# Patient Record
Sex: Female | Born: 1935 | Race: White | Hispanic: No | Marital: Married | Smoking: Never smoker
Health system: Southern US, Community
[De-identification: ages and names within clinical notes are randomized; demographics above are authoritative.]

## PROBLEM LIST (undated history)

## (undated) DIAGNOSIS — M1990 Unspecified osteoarthritis, unspecified site: Secondary | ICD-10-CM

## (undated) DIAGNOSIS — IMO0001 Reserved for inherently not codable concepts without codable children: Secondary | ICD-10-CM

## (undated) DIAGNOSIS — F0390 Unspecified dementia without behavioral disturbance: Secondary | ICD-10-CM

## (undated) DIAGNOSIS — G4730 Sleep apnea, unspecified: Secondary | ICD-10-CM

## (undated) DIAGNOSIS — F329 Major depressive disorder, single episode, unspecified: Secondary | ICD-10-CM

## (undated) DIAGNOSIS — F419 Anxiety disorder, unspecified: Secondary | ICD-10-CM

## (undated) DIAGNOSIS — C801 Malignant (primary) neoplasm, unspecified: Secondary | ICD-10-CM

## (undated) DIAGNOSIS — Z789 Other specified health status: Secondary | ICD-10-CM

## (undated) DIAGNOSIS — F32A Depression, unspecified: Secondary | ICD-10-CM

## (undated) HISTORY — PX: TONSILLECTOMY: SUR1361

## (undated) HISTORY — PX: CATARACT EXTRACTION: SUR2

---

## 2015-09-18 DIAGNOSIS — Z23 Encounter for immunization: Secondary | ICD-10-CM | POA: Diagnosis not present

## 2015-09-27 DIAGNOSIS — G4733 Obstructive sleep apnea (adult) (pediatric): Secondary | ICD-10-CM | POA: Diagnosis not present

## 2015-10-24 DIAGNOSIS — R69 Illness, unspecified: Secondary | ICD-10-CM | POA: Diagnosis not present

## 2015-10-29 DIAGNOSIS — R69 Illness, unspecified: Secondary | ICD-10-CM | POA: Diagnosis not present

## 2015-12-18 DIAGNOSIS — R262 Difficulty in walking, not elsewhere classified: Secondary | ICD-10-CM | POA: Diagnosis not present

## 2015-12-18 DIAGNOSIS — M170 Bilateral primary osteoarthritis of knee: Secondary | ICD-10-CM | POA: Diagnosis not present

## 2015-12-20 DIAGNOSIS — Z6833 Body mass index (BMI) 33.0-33.9, adult: Secondary | ICD-10-CM | POA: Diagnosis not present

## 2015-12-20 DIAGNOSIS — M1711 Unilateral primary osteoarthritis, right knee: Secondary | ICD-10-CM | POA: Diagnosis not present

## 2015-12-20 DIAGNOSIS — E669 Obesity, unspecified: Secondary | ICD-10-CM | POA: Diagnosis not present

## 2015-12-27 DIAGNOSIS — M25461 Effusion, right knee: Secondary | ICD-10-CM | POA: Diagnosis not present

## 2015-12-27 DIAGNOSIS — G8929 Other chronic pain: Secondary | ICD-10-CM | POA: Diagnosis not present

## 2015-12-27 DIAGNOSIS — M1711 Unilateral primary osteoarthritis, right knee: Secondary | ICD-10-CM | POA: Diagnosis not present

## 2015-12-27 DIAGNOSIS — M25561 Pain in right knee: Secondary | ICD-10-CM | POA: Diagnosis not present

## 2016-01-01 DIAGNOSIS — G4733 Obstructive sleep apnea (adult) (pediatric): Secondary | ICD-10-CM | POA: Diagnosis not present

## 2016-01-02 ENCOUNTER — Other Ambulatory Visit: Payer: Self-pay | Admitting: Orthopedic Surgery

## 2016-01-10 ENCOUNTER — Encounter (HOSPITAL_COMMUNITY): Payer: Self-pay

## 2016-01-10 ENCOUNTER — Encounter (HOSPITAL_COMMUNITY)
Admission: RE | Admit: 2016-01-10 | Discharge: 2016-01-10 | Disposition: A | Discharge status: Home / Self Care | Payer: Medicare HMO | Source: Ambulatory Visit | Attending: Orthopedic Surgery | Admitting: Orthopedic Surgery

## 2016-01-10 DIAGNOSIS — Z79899 Other long term (current) drug therapy: Secondary | ICD-10-CM | POA: Diagnosis not present

## 2016-01-10 DIAGNOSIS — R918 Other nonspecific abnormal finding of lung field: Secondary | ICD-10-CM | POA: Insufficient documentation

## 2016-01-10 DIAGNOSIS — Z01812 Encounter for preprocedural laboratory examination: Secondary | ICD-10-CM | POA: Diagnosis not present

## 2016-01-10 DIAGNOSIS — R69 Illness, unspecified: Secondary | ICD-10-CM | POA: Diagnosis not present

## 2016-01-10 DIAGNOSIS — M1711 Unilateral primary osteoarthritis, right knee: Secondary | ICD-10-CM | POA: Insufficient documentation

## 2016-01-10 DIAGNOSIS — Z01818 Encounter for other preprocedural examination: Secondary | ICD-10-CM | POA: Diagnosis not present

## 2016-01-10 DIAGNOSIS — G4733 Obstructive sleep apnea (adult) (pediatric): Secondary | ICD-10-CM | POA: Diagnosis not present

## 2016-01-10 DIAGNOSIS — F329 Major depressive disorder, single episode, unspecified: Secondary | ICD-10-CM | POA: Diagnosis not present

## 2016-01-10 DIAGNOSIS — R9431 Abnormal electrocardiogram [ECG] [EKG]: Secondary | ICD-10-CM | POA: Diagnosis not present

## 2016-01-10 DIAGNOSIS — F0390 Unspecified dementia without behavioral disturbance: Secondary | ICD-10-CM | POA: Insufficient documentation

## 2016-01-10 HISTORY — DX: Anxiety disorder, unspecified: F41.9

## 2016-01-10 HISTORY — DX: Sleep apnea, unspecified: G47.30

## 2016-01-10 HISTORY — DX: Unspecified dementia, unspecified severity, without behavioral disturbance, psychotic disturbance, mood disturbance, and anxiety: F03.90

## 2016-01-10 HISTORY — DX: Reserved for inherently not codable concepts without codable children: IMO0001

## 2016-01-10 HISTORY — DX: Unspecified osteoarthritis, unspecified site: M19.90

## 2016-01-10 HISTORY — DX: Depression, unspecified: F32.A

## 2016-01-10 HISTORY — DX: Malignant (primary) neoplasm, unspecified: C80.1

## 2016-01-10 HISTORY — DX: Major depressive disorder, single episode, unspecified: F32.9

## 2016-01-10 LAB — CBC WITH DIFFERENTIAL/PLATELET
BASOS ABS: 0 10*3/uL (ref 0.0–0.1)
BASOS PCT: 0 %
Eosinophils Absolute: 0.1 10*3/uL (ref 0.0–0.7)
Eosinophils Relative: 1 %
HEMATOCRIT: 44.8 % (ref 36.0–46.0)
HEMOGLOBIN: 14.8 g/dL (ref 12.0–15.0)
Lymphocytes Relative: 25 %
Lymphs Abs: 1.7 10*3/uL (ref 0.7–4.0)
MCH: 29.8 pg (ref 26.0–34.0)
MCHC: 33 g/dL (ref 30.0–36.0)
MCV: 90.1 fL (ref 78.0–100.0)
Monocytes Absolute: 0.7 10*3/uL (ref 0.1–1.0)
Monocytes Relative: 10 %
NEUTROS ABS: 4.2 10*3/uL (ref 1.7–7.7)
NEUTROS PCT: 64 %
Platelets: 203 10*3/uL (ref 150–400)
RBC: 4.97 MIL/uL (ref 3.87–5.11)
RDW: 13 % (ref 11.5–15.5)
WBC: 6.7 10*3/uL (ref 4.0–10.5)

## 2016-01-10 LAB — COMPREHENSIVE METABOLIC PANEL
ALBUMIN: 4 g/dL (ref 3.5–5.0)
ALK PHOS: 50 U/L (ref 38–126)
ALT: 16 U/L (ref 14–54)
AST: 20 U/L (ref 15–41)
Anion gap: 8 (ref 5–15)
BILIRUBIN TOTAL: 0.8 mg/dL (ref 0.3–1.2)
BUN: 15 mg/dL (ref 6–20)
CALCIUM: 9.5 mg/dL (ref 8.9–10.3)
CO2: 26 mmol/L (ref 22–32)
Chloride: 106 mmol/L (ref 101–111)
Creatinine, Ser: 0.83 mg/dL (ref 0.44–1.00)
GFR calc Af Amer: >60 mL/min (ref >60)
GFR calc non Af Amer: >60 mL/min (ref >60)
GLUCOSE: 99 mg/dL (ref 65–99)
POTASSIUM: 4.2 mmol/L (ref 3.5–5.1)
SODIUM: 140 mmol/L (ref 135–145)
TOTAL PROTEIN: 7.1 g/dL (ref 6.5–8.1)

## 2016-01-10 LAB — URINALYSIS, ROUTINE W REFLEX MICROSCOPIC
Glucose, UA: NEGATIVE mg/dL
Hgb urine dipstick: NEGATIVE
Ketones, ur: 15 mg/dL — AB
NITRITE: NEGATIVE
PH: 5 (ref 5.0–8.0)
Protein, ur: NEGATIVE mg/dL
SPECIFIC GRAVITY, URINE: 1.026 (ref 1.005–1.030)

## 2016-01-10 LAB — SURGICAL PCR SCREEN
MRSA, PCR: NEGATIVE
Staphylococcus aureus: NEGATIVE

## 2016-01-10 LAB — PROTIME-INR
INR: 1.05 (ref 0.00–1.49)
Prothrombin Time: 13.9 seconds (ref 11.6–15.2)

## 2016-01-10 LAB — URINE MICROSCOPIC-ADD ON

## 2016-01-10 LAB — APTT: APTT: 28 s (ref 24–37)

## 2016-01-10 NOTE — Pre-Procedure Instructions (Signed)
    Brianna Pineda  01/10/2016      [facility] - [place], [place] - [place]  [place] [address] Phone: [phone] Fax: [phone]    Your procedure is scheduled on 01/21/16.  Report to [facility] Admitting at 530 A.M.  Call this number if you have problems the morning of surgery:  [phone]   Remember:  Do not eat food or drink liquids after midnight.  Take these medicines the morning of surgery with A SIP OF WATER --namenda,effexor   Do not wear jewelry, make-up or nail polish.  Do not wear lotions, powders, or perfumes.  You may wear deodorant.  Do not shave 48 hours prior to surgery.  Men may shave face and neck.  Do not bring valuables to the hospital.  [facility] is not responsible for any belongings or valuables.  Contacts, dentures or bridgework may not be worn into surgery.  Leave your suitcase in the car.  After surgery it may be brought to your room.  For patients admitted to the hospital, discharge time will be determined by your treatment team.  Patients discharged the day of surgery will not be allowed to drive home.   Name and phone number of your driver:    Special instructions:    Please read over the following fact sheets that you were given. Pain Booklet, Coughing and Deep Breathing, MRSA Information and Surgical Site Infection Prevention

## 2016-01-11 ENCOUNTER — Ambulatory Visit
Admission: RE | Admit: 2016-01-11 | Discharge: 2016-01-11 | Disposition: A | Discharge status: Home / Self Care | Payer: Medicare HMO | Source: Ambulatory Visit | Attending: Orthopedic Surgery | Admitting: Orthopedic Surgery

## 2016-01-11 ENCOUNTER — Other Ambulatory Visit: Payer: Self-pay | Admitting: Orthopedic Surgery

## 2016-01-11 DIAGNOSIS — M1711 Unilateral primary osteoarthritis, right knee: Secondary | ICD-10-CM | POA: Diagnosis not present

## 2016-01-11 DIAGNOSIS — R918 Other nonspecific abnormal finding of lung field: Secondary | ICD-10-CM | POA: Diagnosis not present

## 2016-01-11 DIAGNOSIS — R9389 Abnormal findings on diagnostic imaging of other specified body structures: Secondary | ICD-10-CM

## 2016-01-11 LAB — URINE CULTURE

## 2016-01-11 MED ORDER — IOPAMIDOL (ISOVUE-300) INJECTION 61%
75.0000 mL | Freq: Once | INTRAVENOUS | Status: AC | PRN
  Administered 2016-01-11: 75 mL via INTRAVENOUS

## 2016-01-15 NOTE — Progress Notes (Signed)
Anesthesia Chart Review: Patient is a 82 year old female scheduled for right TKA on 01/21/16 by Dr. Ronnie Derby.   History includes non-smoker, dementia, depression, skin cancer excision (nose), arthritis, tonsillectomy, OSA with CPAP, SOB. BMI is consistent with obesity. Jehovah's Witness. PCP is Dr. Nelda Bucks.  Meds include Namenda XR, rivastigmine, Effexor XR. She is on Namenda and rivastigmine for her memory. Discussed holding these after her Sunday 01/20/16 AM dose but can resume after her 01/21/16 surgery.    01/10/16 EKG: NSR, possible inferior infarct (age undetermined), possible anterior infarct (age undetermined). There is no comparison EKG on her PCP office or in Epic/Muse. She denied CP, SOB at rest, palpitations, syncope. Denied prior stress, echo, or cath. Over the past month, she has had had more knee pain and is not doing much activity. Prior to that she was going to the gym and walking on a treadmill for 10 minutes and doing reflexology without CV symptoms.   01/10/16 CXR: IMPRESSION: Parenchymal density in the left upper lobe and likely in the right middle lobe. The findings are nonspecific. Given the lack of any previous studies, chest CT scanning is recommended.  01/11/16 Chest CT w/ contrast: IMPRESSION: 14 x 8 mm anterior segment of left upper lobe subpleural ground-glass opacity without a solid component. Differential diagnosis includes posttraumatic change, bronchoalveolar carcinoma, or inflammatory change. Follow-up CT of the chest in 3 months is Recommended. No significant mediastinal or hilar lymphadenopathy.  Preoperative labs noted. Urine culture showed multiple species present, suggest recollection.  History and EKG reviewed with anesthesiologist Dr. Tobias Alexander. If patient remains asymptomatic from a CV standpoint then it is anticipated that she can proceed as planned. She did say that Dr. Ronnie Derby discussed GA versus spinal anesthesia. She is still considering her options.    George Hugh [facility]/Anesthesiology Phone [phone] 01/15/2016 3:47 PM

## 2016-01-20 MED ORDER — SODIUM CHLORIDE 0.9 % IV SOLN: INTRAVENOUS | Status: DC

## 2016-01-20 MED ORDER — BUPIVACAINE LIPOSOME 1.3 % IJ SUSP
20.0000 mL | INTRAMUSCULAR | Status: AC
  Administered 2016-01-21: 20 mL
  Filled 2016-01-20: qty 20

## 2016-01-20 MED ORDER — CEFAZOLIN SODIUM-DEXTROSE 2-3 GM-% IV SOLR
2.0000 g | INTRAVENOUS | Status: AC
  Administered 2016-01-21: 2 g via INTRAVENOUS
  Filled 2016-01-20: qty 50

## 2016-01-20 MED ORDER — CHLORHEXIDINE GLUCONATE 4 % EX LIQD: 60.0000 mL | Freq: Once | CUTANEOUS | Status: DC

## 2016-01-20 MED ORDER — TRANEXAMIC ACID 1000 MG/10ML IV SOLN
1000.0000 mg | INTRAVENOUS | Status: AC
  Administered 2016-01-21: 1000 mg via INTRAVENOUS
  Filled 2016-01-20: qty 10

## 2016-01-20 NOTE — Anesthesia Preprocedure Evaluation (Addendum)
Anesthesia Evaluation  Patient identified by MRN, date of birth, ID band Patient awake    Reviewed: Allergy & Precautions, NPO status , Patient's Chart, lab work & pertinent test results  History of Anesthesia Complications Negative for: history of anesthetic complications  Airway Mallampati: II  TM Distance: >3 FB Neck ROM: Full    Dental  (+) Poor Dentition, Dental Advisory Given   Pulmonary sleep apnea ,    Pulmonary exam normal        Cardiovascular negative cardio ROS Normal cardiovascular exam     Neuro/Psych PSYCHIATRIC DISORDERS Anxiety Depression dementia    GI/Hepatic negative GI ROS, Neg liver ROS,   Endo/Other  negative endocrine ROS  Renal/GU negative Renal ROS     Musculoskeletal   Abdominal   Peds  Hematology   Anesthesia Other Findings   Reproductive/Obstetrics                            Anesthesia Physical Anesthesia Plan  ASA: II  Anesthesia Plan: MAC   Post-op Pain Management:    Induction:   Airway Management Planned: Simple Face Mask  Additional Equipment:   Intra-op Plan:   Post-operative Plan:   Informed Consent: I have reviewed the patients History and Physical, chart, labs and discussed the procedure including the risks, benefits and alternatives for the proposed anesthesia with the patient or authorized representative who has indicated his/her understanding and acceptance.   Dental advisory given  Plan Discussed with: CRNA and Anesthesiologist  Anesthesia Plan Comments:        Anesthesia Quick Evaluation

## 2016-01-21 ENCOUNTER — Encounter (HOSPITAL_COMMUNITY): Payer: Self-pay | Admitting: Certified Registered Nurse Anesthetist

## 2016-01-21 ENCOUNTER — Inpatient Hospital Stay (HOSPITAL_COMMUNITY): Payer: Medicare HMO | Admitting: Certified Registered Nurse Anesthetist

## 2016-01-21 ENCOUNTER — Inpatient Hospital Stay (HOSPITAL_COMMUNITY)
Admission: RE | Admit: 2016-01-21 | Discharge: 2016-01-22 | DRG: 470 | Disposition: A | Discharge status: Home / Self Care | Payer: Medicare HMO | Source: Ambulatory Visit | Attending: Orthopedic Surgery | Admitting: Orthopedic Surgery

## 2016-01-21 ENCOUNTER — Inpatient Hospital Stay (HOSPITAL_COMMUNITY): Payer: Medicare HMO | Admitting: Vascular Surgery

## 2016-01-21 ENCOUNTER — Encounter (HOSPITAL_COMMUNITY)
Admission: RE | Disposition: A | Discharge status: Home / Self Care | Payer: Self-pay | Source: Ambulatory Visit | Attending: Orthopedic Surgery

## 2016-01-21 DIAGNOSIS — D62 Acute posthemorrhagic anemia: Secondary | ICD-10-CM | POA: Diagnosis not present

## 2016-01-21 DIAGNOSIS — Z85828 Personal history of other malignant neoplasm of skin: Secondary | ICD-10-CM

## 2016-01-21 DIAGNOSIS — M1711 Unilateral primary osteoarthritis, right knee: Secondary | ICD-10-CM | POA: Diagnosis not present

## 2016-01-21 DIAGNOSIS — Z79899 Other long term (current) drug therapy: Secondary | ICD-10-CM | POA: Diagnosis not present

## 2016-01-21 DIAGNOSIS — F329 Major depressive disorder, single episode, unspecified: Secondary | ICD-10-CM | POA: Diagnosis present

## 2016-01-21 DIAGNOSIS — Z96659 Presence of unspecified artificial knee joint: Secondary | ICD-10-CM

## 2016-01-21 DIAGNOSIS — F419 Anxiety disorder, unspecified: Secondary | ICD-10-CM | POA: Diagnosis present

## 2016-01-21 DIAGNOSIS — M25561 Pain in right knee: Secondary | ICD-10-CM | POA: Diagnosis present

## 2016-01-21 DIAGNOSIS — G4730 Sleep apnea, unspecified: Secondary | ICD-10-CM | POA: Diagnosis present

## 2016-01-21 DIAGNOSIS — F0390 Unspecified dementia without behavioral disturbance: Secondary | ICD-10-CM | POA: Diagnosis present

## 2016-01-21 DIAGNOSIS — R69 Illness, unspecified: Secondary | ICD-10-CM | POA: Diagnosis not present

## 2016-01-21 DIAGNOSIS — M179 Osteoarthritis of knee, unspecified: Secondary | ICD-10-CM | POA: Diagnosis not present

## 2016-01-21 HISTORY — PX: TOTAL KNEE ARTHROPLASTY: SHX125

## 2016-01-21 LAB — CBC
HEMATOCRIT: 41.2 % (ref 36.0–46.0)
Hemoglobin: 13.6 g/dL (ref 12.0–15.0)
MCH: 30.3 pg (ref 26.0–34.0)
MCHC: 33 g/dL (ref 30.0–36.0)
MCV: 91.8 fL (ref 78.0–100.0)
PLATELETS: 163 10*3/uL (ref 150–400)
RBC: 4.49 MIL/uL (ref 3.87–5.11)
RDW: 13.5 % (ref 11.5–15.5)
WBC: 5.3 10*3/uL (ref 4.0–10.5)

## 2016-01-21 LAB — CREATININE, SERUM
Creatinine, Ser: 0.91 mg/dL (ref 0.44–1.00)
GFR calc non Af Amer: 58 mL/min — ABNORMAL LOW (ref >60)

## 2016-01-21 SURGERY — ARTHROPLASTY, KNEE, TOTAL
Anesthesia: Monitor Anesthesia Care | Site: Knee | Laterality: Right

## 2016-01-21 MED ORDER — PROPOFOL 10 MG/ML IV BOLUS
INTRAVENOUS | Status: DC | PRN
  Administered 2016-01-21: 10 mg via INTRAVENOUS

## 2016-01-21 MED ORDER — ONDANSETRON HCL 4 MG/2ML IJ SOLN
INTRAMUSCULAR | Status: AC
  Filled 2016-01-21: qty 2

## 2016-01-21 MED ORDER — ZOLPIDEM TARTRATE 5 MG PO TABS: 5.0000 mg | ORAL_TABLET | Freq: Every evening | ORAL | Status: DC | PRN

## 2016-01-21 MED ORDER — HYDROMORPHONE HCL 1 MG/ML IJ SOLN
1.0000 mg | INTRAMUSCULAR | Status: DC | PRN
  Filled 2016-01-21: qty 1

## 2016-01-21 MED ORDER — SODIUM CHLORIDE 0.9 % IV SOLN
INTRAVENOUS | Status: DC
  Administered 2016-01-21: 13:00:00 via INTRAVENOUS

## 2016-01-21 MED ORDER — CEFAZOLIN SODIUM-DEXTROSE 2-3 GM-% IV SOLR
2.0000 g | Freq: Four times a day (QID) | INTRAVENOUS | Status: AC
  Administered 2016-01-21 (×2): 2 g via INTRAVENOUS
  Filled 2016-01-21 (×2): qty 50

## 2016-01-21 MED ORDER — FENTANYL CITRATE (PF) 100 MCG/2ML IJ SOLN
INTRAMUSCULAR | Status: DC | PRN
  Administered 2016-01-21 (×3): 25 ug via INTRAVENOUS

## 2016-01-21 MED ORDER — PROPOFOL 10 MG/ML IV BOLUS
INTRAVENOUS | Status: AC
  Filled 2016-01-21: qty 20

## 2016-01-21 MED ORDER — BUPIVACAINE IN DEXTROSE 0.75-8.25 % IT SOLN
INTRATHECAL | Status: DC | PRN
  Administered 2016-01-21: 15 mg via INTRATHECAL

## 2016-01-21 MED ORDER — ONDANSETRON HCL 4 MG/2ML IJ SOLN: 4.0000 mg | Freq: Four times a day (QID) | INTRAMUSCULAR | Status: DC | PRN

## 2016-01-21 MED ORDER — PHENOL 1.4 % MT LIQD: 1.0000 | OROMUCOSAL | Status: DC | PRN

## 2016-01-21 MED ORDER — SODIUM CHLORIDE 0.9 % IR SOLN
Status: DC | PRN
  Administered 2016-01-21: 3000 mL

## 2016-01-21 MED ORDER — DEXAMETHASONE SODIUM PHOSPHATE 4 MG/ML IJ SOLN
INTRAMUSCULAR | Status: DC | PRN
  Administered 2016-01-21: 8 mg via INTRAVENOUS

## 2016-01-21 MED ORDER — LACTATED RINGERS IV SOLN
INTRAVENOUS | Status: DC | PRN
  Administered 2016-01-21 (×2): via INTRAVENOUS

## 2016-01-21 MED ORDER — ACETAMINOPHEN 650 MG RE SUPP: 650.0000 mg | Freq: Four times a day (QID) | RECTAL | Status: DC | PRN

## 2016-01-21 MED ORDER — MEMANTINE HCL ER 28 MG PO CP24
28.0000 mg | ORAL_CAPSULE | Freq: Every day | ORAL | Status: DC
  Administered 2016-01-22: 28 mg via ORAL
  Filled 2016-01-21: qty 1

## 2016-01-21 MED ORDER — CELECOXIB 200 MG PO CAPS
200.0000 mg | ORAL_CAPSULE | Freq: Two times a day (BID) | ORAL | Status: DC
  Administered 2016-01-21 – 2016-01-22 (×3): 200 mg via ORAL
  Filled 2016-01-21 (×3): qty 1

## 2016-01-21 MED ORDER — FLEET ENEMA 7-19 GM/118ML RE ENEM
1.0000 | ENEMA | Freq: Once | RECTAL | Status: DC | PRN
Start: 2016-01-21 — End: 2016-01-22

## 2016-01-21 MED ORDER — TRANEXAMIC ACID 1000 MG/10ML IV SOLN
1000.0000 mg | Freq: Once | INTRAVENOUS | Status: AC
  Administered 2016-01-21: 1000 mg via INTRAVENOUS
  Filled 2016-01-21 (×3): qty 10

## 2016-01-21 MED ORDER — LIDOCAINE HCL (CARDIAC) 20 MG/ML IV SOLN
INTRAVENOUS | Status: AC
  Filled 2016-01-21: qty 5

## 2016-01-21 MED ORDER — BUPIVACAINE-EPINEPHRINE (PF) 0.25% -1:200000 IJ SOLN
INTRAMUSCULAR | Status: DC | PRN
  Administered 2016-01-21: 30 mL via PERINEURAL

## 2016-01-21 MED ORDER — 0.9 % SODIUM CHLORIDE (POUR BTL) OPTIME
TOPICAL | Status: DC | PRN
  Administered 2016-01-21: 1000 mL

## 2016-01-21 MED ORDER — DEXAMETHASONE SODIUM PHOSPHATE 4 MG/ML IJ SOLN
INTRAMUSCULAR | Status: AC
  Filled 2016-01-21: qty 2

## 2016-01-21 MED ORDER — RIVASTIGMINE TARTRATE 1.5 MG PO CAPS
4.5000 mg | ORAL_CAPSULE | Freq: Two times a day (BID) | ORAL | Status: DC
  Administered 2016-01-21 – 2016-01-22 (×2): 4.5 mg via ORAL
  Filled 2016-01-21 (×4): qty 1

## 2016-01-21 MED ORDER — DIPHENHYDRAMINE HCL 12.5 MG/5ML PO ELIX: 12.5000 mg | ORAL_SOLUTION | ORAL | Status: DC | PRN

## 2016-01-21 MED ORDER — OXYCODONE HCL 5 MG PO TABS
5.0000 mg | ORAL_TABLET | ORAL | Status: DC | PRN
  Administered 2016-01-21 – 2016-01-22 (×4): 5 mg via ORAL
  Administered 2016-01-22 (×2): 10 mg via ORAL
  Filled 2016-01-21: qty 2
  Filled 2016-01-21 (×4): qty 1
  Filled 2016-01-21: qty 2

## 2016-01-21 MED ORDER — PROMETHAZINE HCL 25 MG/ML IJ SOLN: 6.2500 mg | INTRAMUSCULAR | Status: DC | PRN

## 2016-01-21 MED ORDER — OXYCODONE HCL ER 10 MG PO T12A
10.0000 mg | EXTENDED_RELEASE_TABLET | Freq: Two times a day (BID) | ORAL | Status: DC
  Administered 2016-01-21 – 2016-01-22 (×3): 10 mg via ORAL
  Filled 2016-01-21 (×3): qty 1

## 2016-01-21 MED ORDER — METOCLOPRAMIDE HCL 5 MG/ML IJ SOLN: 5.0000 mg | Freq: Three times a day (TID) | INTRAMUSCULAR | Status: DC | PRN

## 2016-01-21 MED ORDER — SENNOSIDES-DOCUSATE SODIUM 8.6-50 MG PO TABS: 1.0000 | ORAL_TABLET | Freq: Every evening | ORAL | Status: DC | PRN

## 2016-01-21 MED ORDER — BUPIVACAINE-EPINEPHRINE (PF) 0.25% -1:200000 IJ SOLN
INTRAMUSCULAR | Status: AC
  Filled 2016-01-21: qty 30

## 2016-01-21 MED ORDER — ALUM & MAG HYDROXIDE-SIMETH 200-200-20 MG/5ML PO SUSP: 30.0000 mL | ORAL | Status: DC | PRN

## 2016-01-21 MED ORDER — METOCLOPRAMIDE HCL 5 MG PO TABS: 5.0000 mg | ORAL_TABLET | Freq: Three times a day (TID) | ORAL | Status: DC | PRN

## 2016-01-21 MED ORDER — DEXTROSE 5 % IV SOLN
500.0000 mg | Freq: Four times a day (QID) | INTRAVENOUS | Status: DC | PRN
  Filled 2016-01-21: qty 5

## 2016-01-21 MED ORDER — ONDANSETRON HCL 4 MG PO TABS: 4.0000 mg | ORAL_TABLET | Freq: Four times a day (QID) | ORAL | Status: DC | PRN

## 2016-01-21 MED ORDER — BISACODYL 5 MG PO TBEC: 5.0000 mg | DELAYED_RELEASE_TABLET | Freq: Every day | ORAL | Status: DC | PRN

## 2016-01-21 MED ORDER — MENTHOL 3 MG MT LOZG: 1.0000 | LOZENGE | OROMUCOSAL | Status: DC | PRN

## 2016-01-21 MED ORDER — HYDROMORPHONE HCL 1 MG/ML IJ SOLN: 0.2500 mg | INTRAMUSCULAR | Status: DC | PRN

## 2016-01-21 MED ORDER — PROPOFOL 500 MG/50ML IV EMUL
INTRAVENOUS | Status: DC | PRN
  Administered 2016-01-21: 40 ug/kg/min via INTRAVENOUS

## 2016-01-21 MED ORDER — FENTANYL CITRATE (PF) 250 MCG/5ML IJ SOLN
INTRAMUSCULAR | Status: AC
  Filled 2016-01-21: qty 5

## 2016-01-21 MED ORDER — ACETAMINOPHEN 325 MG PO TABS: 650.0000 mg | ORAL_TABLET | Freq: Four times a day (QID) | ORAL | Status: DC | PRN

## 2016-01-21 MED ORDER — ENOXAPARIN SODIUM 30 MG/0.3ML ~~LOC~~ SOLN
30.0000 mg | Freq: Two times a day (BID) | SUBCUTANEOUS | Status: DC
  Administered 2016-01-22: 30 mg via SUBCUTANEOUS
  Filled 2016-01-21: qty 0.3

## 2016-01-21 MED ORDER — SODIUM CHLORIDE 0.9 % IJ SOLN
INTRAMUSCULAR | Status: DC | PRN
  Administered 2016-01-21: 20 mL via INTRAVENOUS

## 2016-01-21 MED ORDER — METHOCARBAMOL 500 MG PO TABS
500.0000 mg | ORAL_TABLET | Freq: Four times a day (QID) | ORAL | Status: DC | PRN
  Administered 2016-01-21 – 2016-01-22 (×2): 500 mg via ORAL
  Filled 2016-01-21 (×2): qty 1

## 2016-01-21 MED ORDER — ONDANSETRON HCL 4 MG/2ML IJ SOLN
INTRAMUSCULAR | Status: DC | PRN
Start: 2016-01-21 — End: 2016-01-21
  Administered 2016-01-21: 4 mg via INTRAVENOUS

## 2016-01-21 MED ORDER — DOCUSATE SODIUM 100 MG PO CAPS
100.0000 mg | ORAL_CAPSULE | Freq: Two times a day (BID) | ORAL | Status: DC
  Administered 2016-01-21 – 2016-01-22 (×3): 100 mg via ORAL
  Filled 2016-01-21 (×3): qty 1

## 2016-01-21 MED ORDER — VENLAFAXINE HCL ER 75 MG PO CP24
75.0000 mg | ORAL_CAPSULE | Freq: Every day | ORAL | Status: DC
  Filled 2016-01-21: qty 1

## 2016-01-21 SURGICAL SUPPLY — 61 items
BANDAGE ESMARK 6X9 LF (GAUZE/BANDAGES/DRESSINGS) ×1 IMPLANT
BLADE SAGITTAL 13X1.27X60 (BLADE) ×2 IMPLANT
BLADE SAW SGTL 83.5X18.5 (BLADE) ×2 IMPLANT
BLADE SURG 10 STRL SS (BLADE) ×2 IMPLANT
BNDG ELASTIC 6X10 VLCR STRL LF (GAUZE/BANDAGES/DRESSINGS) ×2 IMPLANT
BNDG ESMARK 6X9 LF (GAUZE/BANDAGES/DRESSINGS) ×2
BOWL SMART MIX CTS (DISPOSABLE) ×2 IMPLANT
CAPT KNEE TOTAL 3 ×2 IMPLANT
CEMENT BONE SIMPLEX SPEEDSET (Cement) ×4 IMPLANT
COVER SURGICAL LIGHT HANDLE (MISCELLANEOUS) ×2 IMPLANT
CUFF TOURNIQUET SINGLE 34IN LL (TOURNIQUET CUFF) ×2 IMPLANT
DRAPE EXTREMITY T 121X128X90 (DRAPE) ×2 IMPLANT
DRAPE INCISE IOBAN 66X45 STRL (DRAPES) ×4 IMPLANT
DRAPE PROXIMA HALF (DRAPES) IMPLANT
DRAPE U-SHAPE 47X51 STRL (DRAPES) ×2 IMPLANT
DRSG ADAPTIC 3X8 NADH LF (GAUZE/BANDAGES/DRESSINGS) ×2 IMPLANT
DRSG PAD ABDOMINAL 8X10 ST (GAUZE/BANDAGES/DRESSINGS) ×2 IMPLANT
DURAPREP 26ML APPLICATOR (WOUND CARE) ×4 IMPLANT
ELECT REM PT RETURN 9FT ADLT (ELECTROSURGICAL) ×2
ELECTRODE REM PT RTRN 9FT ADLT (ELECTROSURGICAL) ×1 IMPLANT
GAUZE SPONGE 4X4 12PLY STRL (GAUZE/BANDAGES/DRESSINGS) ×2 IMPLANT
GLOVE BIOGEL M 7.0 STRL (GLOVE) IMPLANT
GLOVE BIOGEL PI IND STRL 6 (GLOVE) ×1 IMPLANT
GLOVE BIOGEL PI IND STRL 7.5 (GLOVE) IMPLANT
GLOVE BIOGEL PI IND STRL 8.5 (GLOVE) ×5 IMPLANT
GLOVE BIOGEL PI INDICATOR 6 (GLOVE) ×1
GLOVE BIOGEL PI INDICATOR 7.5 (GLOVE)
GLOVE BIOGEL PI INDICATOR 8.5 (GLOVE) ×5
GLOVE SURG ORTHO 8.0 STRL STRW (GLOVE) ×12 IMPLANT
GLOVE SURG SS PI 6.5 STRL IVOR (GLOVE) ×2 IMPLANT
GOWN STRL REUS W/ TWL LRG LVL3 (GOWN DISPOSABLE) ×1 IMPLANT
GOWN STRL REUS W/ TWL XL LVL3 (GOWN DISPOSABLE) ×2 IMPLANT
GOWN STRL REUS W/TWL 2XL LVL3 (GOWN DISPOSABLE) ×2 IMPLANT
GOWN STRL REUS W/TWL LRG LVL3 (GOWN DISPOSABLE) ×1
GOWN STRL REUS W/TWL XL LVL3 (GOWN DISPOSABLE) ×2
HANDPIECE INTERPULSE COAX TIP (DISPOSABLE) ×1
HOOD PEEL AWAY FACE SHEILD DIS (HOOD) ×6 IMPLANT
KIT BASIN OR (CUSTOM PROCEDURE TRAY) ×2 IMPLANT
KIT ROOM TURNOVER OR (KITS) ×2 IMPLANT
KNEE CAPITATED TOTAL 3 ×1 IMPLANT
MANIFOLD NEPTUNE II (INSTRUMENTS) ×2 IMPLANT
NEEDLE 22X1 1/2 (OR ONLY) (NEEDLE) ×4 IMPLANT
NS IRRIG 1000ML POUR BTL (IV SOLUTION) ×2 IMPLANT
PACK TOTAL JOINT (CUSTOM PROCEDURE TRAY) ×2 IMPLANT
PACK UNIVERSAL I (CUSTOM PROCEDURE TRAY) ×2 IMPLANT
PAD ARMBOARD 7.5X6 YLW CONV (MISCELLANEOUS) ×4 IMPLANT
PADDING CAST COTTON 6X4 STRL (CAST SUPPLIES) ×2 IMPLANT
SET HNDPC FAN SPRY TIP SCT (DISPOSABLE) ×1 IMPLANT
STAPLER VISISTAT 35W (STAPLE) ×2 IMPLANT
SUCTION FRAZIER HANDLE 10FR (MISCELLANEOUS) ×1
SUCTION TUBE FRAZIER 10FR DISP (MISCELLANEOUS) ×1 IMPLANT
SUT BONE WAX W31G (SUTURE) ×2 IMPLANT
SUT VIC AB 0 CTB1 27 (SUTURE) ×4 IMPLANT
SUT VIC AB 1 CT1 27 (SUTURE) ×2
SUT VIC AB 1 CT1 27XBRD ANBCTR (SUTURE) ×2 IMPLANT
SUT VIC AB 2-0 CT1 27 (SUTURE) ×2
SUT VIC AB 2-0 CT1 TAPERPNT 27 (SUTURE) ×2 IMPLANT
SYR 20CC LL (SYRINGE) ×4 IMPLANT
TOWEL OR 17X24 6PK STRL BLUE (TOWEL DISPOSABLE) ×2 IMPLANT
TOWEL OR 17X26 10 PK STRL BLUE (TOWEL DISPOSABLE) ×2 IMPLANT
WATER STERILE IRR 1000ML POUR (IV SOLUTION) ×4 IMPLANT

## 2016-01-21 NOTE — Progress Notes (Signed)
Pt refused to take her dementia medicine, Exelon, at bedtime. She stated being, "afraid to throw up". Pt is alert and oriented x4. Husband is in the room, staying overnight. Pt was educated. Will continue to monitor.

## 2016-01-21 NOTE — Progress Notes (Signed)
RN rquested the tranexamic acid at 1239pm. Pharmacy stated that the request was done at 1249. RN looked at both tube stations for the medication. RN called pharmacy, they stated they would resend it .

## 2016-01-21 NOTE — H&P (Signed)
Brianna Pineda MRN:  [id number] DOB/SEX:  [birth year]/female  CHIEF COMPLAINT:  Painful right Knee  HISTORY: Patient is a 82 y.o. female presented with a history of pain in the right knee. Onset of symptoms was gradual starting several years ago with gradually worsening course since that time. Prior procedures on the knee include arthroscopy. Patient has been treated conservatively with over-the-counter NSAIDs and activity modification. Patient currently rates pain in the knee at 10 out of 10 with activity. There is pain at night.  PAST MEDICAL HISTORY: There are no active problems to display for this patient.  Past Medical History  Diagnosis Date  . Dementia   . Shortness of breath dyspnea   . Depression   . Anxiety   . Arthritis   . Cancer ([place])     on nose,removed  . Sleep apnea     cpap   Past Surgical History  Procedure Laterality Date  . Tonsillectomy       MEDICATIONS:   Prescriptions prior to admission  Medication Sig Dispense Refill Last Dose  . memantine (NAMENDA XR) 28 MG CP24 24 hr capsule Take 28 mg by mouth daily.   01/21/2016 at Unknown time  . Multiple Vitamins-Minerals (MULTIVITAMIN WITH MINERALS) tablet Take 1 tablet by mouth daily.   01/20/2016 at Unknown time  . rivastigmine (EXELON) 4.5 MG capsule Take 4.5 mg by mouth 2 (two) times daily.     Marland Kitchen venlafaxine XR (EFFEXOR-XR) 75 MG 24 hr capsule Take 75 mg by mouth daily with breakfast.   01/21/2016 at Unknown time  . Vitamin D, Ergocalciferol, (DRISDOL) 50000 units CAPS capsule Take 50,000 Units by mouth every 7 (seven) days. Friday   Past Week at Unknown time    ALLERGIES:   Allergies  Allergen Reactions  . Other     NO BLOOD OR BLOOD PRODUCTS    REVIEW OF SYSTEMS:  Pertinent items noted in HPI and remainder of comprehensive ROS otherwise negative.   FAMILY HISTORY:  No family history on file.  SOCIAL HISTORY:   Social History  Substance Use Topics  . Smoking status: Never Smoker   . Smokeless tobacco:  Not on file  . Alcohol Use: Yes     Comment: occ     EXAMINATION:  Vital signs in last 24 hours:    General appearance: alert, cooperative and no distress Lungs: clear to auscultation bilaterally Heart: regular rate and rhythm, S1, S2 normal, no murmur, click, rub or gallop Abdomen: soft, non-tender; bowel sounds normal; no masses,  no organomegaly Extremities: extremities normal, atraumatic, no cyanosis or edema and Homans sign is negative, no sign of DVT Pulses: 2+ and symmetric Skin: Skin color, texture, turgor normal. No rashes or lesions Neurologic: Alert and oriented X 3, normal strength and tone. Normal symmetric reflexes. Normal coordination and gait  Musculoskeletal:  ROM 0-100, Ligaments intact,  Imaging Review Plain radiographs demonstrate severe degenerative joint disease of the right knee. The overall alignment is significant varus. The bone quality appears to be good for age and reported activity level.  Assessment/Plan: Primary osteoarthritis, right knee   The patient history, physical examination and imaging studies are consistent with advanced degenerative joint disease of the right knee. The patient has failed conservative treatment.  The clearance notes were reviewed.  After discussion with the patient it was felt that Total Knee Replacement was indicated. The procedure,  risks, and benefits of total knee arthroplasty were presented and reviewed. The risks including but not limited to aseptic loosening, infection,  blood clots, vascular injury, stiffness, patella tracking problems complications among others were discussed. The patient acknowledged the explanation, agreed to proceed with the plan.  Vedanth Sirico 01/21/2016, 6:26 AM

## 2016-01-21 NOTE — Transfer of Care (Signed)
Immediate Anesthesia Transfer of Care Note  Patient: Brianna Pineda  Procedure(s) Performed: Procedure(s): RIGHT TOTAL KNEE ARTHROPLASTY (Right)  Patient Location: PACU  Anesthesia Type:MAC and Spinal  Level of Consciousness: awake, alert  and oriented  Airway & Oxygen Therapy: Patient Spontanous Breathing  Post-op Assessment: Report given to RN and Post -op Vital signs reviewed and stable  Post vital signs: stable  Last Vitals:  Filed Vitals:   01/21/16 0625  BP: 157/77  Pulse: 70  Temp: 36.8 C  Resp: 18    Complications: No apparent anesthesia complications

## 2016-01-21 NOTE — Anesthesia Postprocedure Evaluation (Signed)
Anesthesia Post Note  Patient: Ann Huebert  Procedure(s) Performed: Procedure(s) (LRB): RIGHT TOTAL KNEE ARTHROPLASTY (Right)  Patient location during evaluation: PACU Anesthesia Type: Spinal Level of consciousness: oriented and awake and alert Pain management: pain level controlled Vital Signs Assessment: post-procedure vital signs reviewed and stable Respiratory status: spontaneous breathing, respiratory function stable and patient connected to nasal cannula oxygen Cardiovascular status: blood pressure returned to baseline and stable Postop Assessment: no headache and no backache Anesthetic complications: no    Last Vitals:  Filed Vitals:   01/21/16 0930 01/21/16 0945  BP: 127/70 132/70  Pulse: 77 74  Temp:    Resp: 18 14    Last Pain: There were no vitals filed for this visit.               Gilma Bessette DANIEL

## 2016-01-21 NOTE — Evaluation (Signed)
Physical Therapy Evaluation Patient Details Name: Brianna Pineda MRN: [id number] DOB: [birth year] Today's Date: 01/21/2016   History of Present Illness  Pt admitted for R TKA. PMHx: dementia, SOB, depression  Clinical Impression  Pt very pleasant and eager to move. Pt and family educated for knee precaution, bone foam (in use end of session), CPM use, HEP with handout, progression and plan. Pt with decreased strength, ROM, transfers and gait who will benefit from acute therapy to maximize strength, function and gait to decrease burden of care.      Follow Up Recommendations Home health PT    Equipment Recommendations  None recommended by PT    Recommendations for Other Services       Precautions / Restrictions Precautions Precautions: Knee Restrictions Weight Bearing Restrictions: Yes RLE Weight Bearing: Weight bearing as tolerated      Mobility  Bed Mobility Overal bed mobility: Modified Independent             General bed mobility comments: with rail and HOB 20degrees  Transfers Overall transfer level: Needs assistance   Transfers: Sit to/from Stand Sit to Stand: Min guard         General transfer comment: cues for hand placement and safety x 3 trials as pt sitting before fully to surface on first trial  Ambulation/Gait Ambulation/Gait assistance: Min guard Ambulation Distance (Feet): 15 Feet Assistive device: Rolling walker (2 wheeled) Gait Pattern/deviations: Step-through pattern;Decreased stride length;Decreased dorsiflexion - right   Gait velocity interpretation: Below normal speed for age/gender General Gait Details: cues for sequence, position in RW and looking up. pt walked 15', 10', 10'   Stairs            Wheelchair Mobility    Modified Rankin (Stroke Patients Only)       Balance                                             Pertinent Vitals/Pain Pain Assessment: 0-10 Pain Score: 5  Pain Location: right knee Pain  Descriptors / Indicators: Aching Pain Intervention(s): Limited activity within patient's tolerance;Monitored during session;Premedicated before session;Repositioned;RN gave pain meds during session    [place] expects to be discharged to:: Private residence Living Arrangements: Spouse/significant other Available Help at Discharge: Family;Available 24 hours/day Type of Home: House Home Access: Level entry     Home Layout: One level Home Equipment: Walker - 2 wheels;Cane - quad;Bedside commode      Prior Function Level of Independence: Independent with assistive device(s)         Comments: doesn't drive much     Hand Dominance        Extremity/Trunk Assessment   Upper Extremity Assessment: Overall WFL for tasks assessed           Lower Extremity Assessment: RLE deficits/detail RLE Deficits / Details: decreased strength and ROM as expected post op    Cervical / Trunk Assessment: Normal  Communication   Communication: No difficulties  Cognition Arousal/Alertness: Awake/alert Behavior During Therapy: WFL for tasks assessed/performed Overall Cognitive Status: Within Functional Limits for tasks assessed                      General Comments      Exercises Total Joint Exercises Heel Slides: AAROM;Right;5 reps;Supine Straight Leg Raises: AROM;Right;5 reps;Supine Long Arc Quad: AROM;Seated;Right;10 reps  Assessment/Plan    PT Assessment Patient needs continued PT services  PT Diagnosis Difficulty walking;Acute pain   PT Problem List Decreased strength;Decreased range of motion;Decreased activity tolerance;Decreased balance;Decreased mobility;Decreased knowledge of use of DME;Pain  PT Treatment Interventions Gait training;DME instruction;Functional mobility training;Therapeutic activities;Therapeutic exercise;Patient/family education   PT Goals (Current goals can be found in the Care Plan section) Acute Rehab PT Goals Patient  Stated Goal: walk without pain PT Goal Formulation: With patient/family Time For Goal Achievement: 01/28/16 Potential to Achieve Goals: Good    Frequency 7X/week   Barriers to discharge        Co-evaluation               End of Session Equipment Utilized During Treatment: Gait belt Activity Tolerance: Patient tolerated treatment well Patient left: in chair;with call bell/phone within reach;with family/visitor present Nurse Communication: Mobility status;Precautions;Weight bearing status         Time: 1241-1310 PT Time Calculation (min) (ACUTE ONLY): 29 min   Charges:   PT Evaluation $PT Eval Moderate Complexity: 1 Procedure PT Treatments $Gait Training: 8-22 mins   PT G CodesMelford Aase 01/21/2016, 1:19 PM  Elwyn Reach, [place]

## 2016-01-21 NOTE — Progress Notes (Signed)
Orthopedic Tech Progress Note Patient Details:  Brianna Pineda [birth year] [id number]  CPM Right Knee CPM Right Knee: On Right Knee Flexion (Degrees): 90 Right Knee Extension (Degrees): 0 Additional Comments: Trapeze bar and foot roll   [place] Pink 01/21/2016, 9:42 AM

## 2016-01-21 NOTE — Progress Notes (Signed)
Utilization review completed.  

## 2016-01-21 NOTE — Anesthesia Procedure Notes (Signed)
Spinal Patient location during procedure: OR Start time: 01/21/2016 7:28 AM End time: 01/21/2016 7:39 AM Staffing Anesthesiologist: Duane Boston Performed by: anesthesiologist  Preanesthetic Checklist Completed: patient identified, surgical consent, pre-op evaluation, timeout performed, IV checked, risks and benefits discussed and monitors and equipment checked Spinal Block Patient position: sitting Prep: DuraPrep Patient monitoring: cardiac monitor, continuous pulse ox and blood pressure Approach: midline Injection technique: single-shot Needle Needle type: Pencan  Needle gauge: 24 G Needle length: 9 cm Additional Notes Functioning IV was confirmed and monitors were applied. Sterile prep and drape, including hand hygiene and sterile gloves were used. The patient was positioned and the spine was prepped. The skin was anesthetized with lidocaine.  Free flow of clear CSF was obtained prior to injecting local anesthetic into the CSF.  The spinal needle aspirated freely following injection.  The needle was carefully withdrawn.  The patient tolerated the procedure well.

## 2016-01-22 ENCOUNTER — Encounter (HOSPITAL_COMMUNITY): Payer: Self-pay | Admitting: Orthopedic Surgery

## 2016-01-22 DIAGNOSIS — M1711 Unilateral primary osteoarthritis, right knee: Secondary | ICD-10-CM | POA: Diagnosis not present

## 2016-01-22 LAB — BASIC METABOLIC PANEL
Anion gap: 9 (ref 5–15)
BUN: 16 mg/dL (ref 6–20)
CHLORIDE: 104 mmol/L (ref 101–111)
CO2: 26 mmol/L (ref 22–32)
Calcium: 8.6 mg/dL — ABNORMAL LOW (ref 8.9–10.3)
Creatinine, Ser: 0.83 mg/dL (ref 0.44–1.00)
GFR calc Af Amer: >60 mL/min (ref >60)
GFR calc non Af Amer: >60 mL/min (ref >60)
GLUCOSE: 136 mg/dL — AB (ref 65–99)
POTASSIUM: 4.1 mmol/L (ref 3.5–5.1)
SODIUM: 139 mmol/L (ref 135–145)

## 2016-01-22 LAB — CBC
HEMATOCRIT: 36.8 % (ref 36.0–46.0)
Hemoglobin: 11.7 g/dL — ABNORMAL LOW (ref 12.0–15.0)
MCH: 28.9 pg (ref 26.0–34.0)
MCHC: 31.8 g/dL (ref 30.0–36.0)
MCV: 90.9 fL (ref 78.0–100.0)
Platelets: 162 10*3/uL (ref 150–400)
RBC: 4.05 MIL/uL (ref 3.87–5.11)
RDW: 13.2 % (ref 11.5–15.5)
WBC: 11.7 10*3/uL — AB (ref 4.0–10.5)

## 2016-01-22 MED ORDER — OXYCODONE HCL ER 10 MG PO T12A: 10.0000 mg | EXTENDED_RELEASE_TABLET | Freq: Two times a day (BID) | ORAL | Status: DC

## 2016-01-22 MED ORDER — OXYCODONE HCL 5 MG PO TABS: 5.0000 mg | ORAL_TABLET | ORAL | Status: DC | PRN

## 2016-01-22 MED ORDER — ENOXAPARIN SODIUM 40 MG/0.4ML ~~LOC~~ SOLN: 40.0000 mg | SUBCUTANEOUS | Status: DC

## 2016-01-22 MED ORDER — METHOCARBAMOL 500 MG PO TABS: 500.0000 mg | ORAL_TABLET | Freq: Four times a day (QID) | ORAL | Status: DC | PRN

## 2016-01-22 NOTE — Discharge Instructions (Signed)

## 2016-01-22 NOTE — Care Management Note (Signed)
Case Management Note  Patient Details  Name: Brianna Pineda MRN: [id number] Date of Birth: Sep 07, 1932  Subjective/Objective:       S/p right total knee arthroplasty             Action/Plan: Set up with Brianna Pineda by MD office for HHPT but Brianna Pineda not able to work with patient's insurance. Spoke with patient and her husband, she selected Brianna Pineda, contacted Brianna Pineda at Brianna and set up Iron, rolling walker and 3N1 have been delivered to patient's home by Brianna Pineda. Patient stated that her husband and other family members will be able to assist her after discharge.   Expected Discharge Date:                  Expected Discharge Plan:  [place]  In-House Referral:  NA  Discharge planning Services  CM Consult  Post Acute Care Choice:  Durable Medical Equipment, Home Health Choice offered to:  Patient  DME Arranged:  3-N-1, CPM, Walker rolling DME Agency:  Brianna Pineda  HH Arranged:  PT [place] Agency:  Brianna Pineda  Status of Service:  Completed, signed off  Medicare Important Message Given:    Date Medicare IM Given:    Medicare IM give by:    Date Additional Medicare IM Given:    Additional Medicare Important Message give by:     If discussed at [place] of Stay Meetings, dates discussed:    Additional Comments:  Nila Nephew, RN 01/22/2016, 11:32 AM

## 2016-01-22 NOTE — Op Note (Signed)
PATIENT ID:      Brianna Pineda  MRN:     [id number] DOB/AGE:    80-Jul-1933 / 80 y.o.                                             _____________________________           Lenore Manner ASSISTANT NOTE        I assisted Surgeon:  Lara Mulch, MD   ON THE PROCEDURE:  Procedure(s): RIGHT TOTAL KNEE ARTHROPLASTY on 01/21/2016.    I provided my assistance on this case for assistance with exposure, retraction, bleeding control, instrumentation, and closure         Electronicallly signed by [place] 01/22/2016  9:18 AM

## 2016-01-22 NOTE — Progress Notes (Signed)
Physical Therapy Treatment Patient Details Name: Brianna Pineda MRN: [id number] DOB: [birth year] Today's Date: Feb 17, 2016    History of Present Illness Pt admitted for R TKA. PMHx: dementia, SOB, depression    PT Comments    Pt continues to move well and progress with gait and HEP. Pt and spouse educated for HEP and bone foam with plan for the day discussed.   Follow Up Recommendations  Home health PT     Equipment Recommendations       Recommendations for Other Services       Precautions / Restrictions Precautions Precautions: Knee Restrictions RLE Weight Bearing: Weight bearing as tolerated    Mobility  Bed Mobility Overal bed mobility: Modified Independent             General bed mobility comments: bed flat  Transfers Overall transfer level: Needs assistance   Transfers: Sit to/from Stand Sit to Stand: Supervision         General transfer comment: cues for hand placement and safety  Ambulation/Gait Ambulation/Gait assistance: Supervision Ambulation Distance (Feet): 150 Feet Assistive device: Rolling walker (2 wheeled) Gait Pattern/deviations: Step-through pattern;Decreased stride length;Decreased dorsiflexion - right   Gait velocity interpretation: Below normal speed for age/gender General Gait Details: cues for sequence, position in RW and looking up as well as increased dorsiflexion right   Stairs            Wheelchair Mobility    Modified Rankin (Stroke Patients Only)       Balance                                    Cognition Arousal/Alertness: Awake/alert Behavior During Therapy: WFL for tasks assessed/performed Overall Cognitive Status: Within Functional Limits for tasks assessed                      Exercises Total Joint Exercises Short Arc Quad: AROM;Right;10 reps;Supine Heel Slides: AAROM;Right;Supine;10 reps Hip ABduction/ADduction: AROM;Right;10 reps;Supine Straight Leg Raises: AROM;Right;10  reps;Supine    General Comments        Pertinent Vitals/Pain Pain Score: 3  Pain Location: right knee at rest, 5/10 with gait Pain Descriptors / Indicators: Aching Pain Intervention(s): Limited activity within patient's tolerance;Monitored during session;Premedicated before session;Repositioned    Home Living                      Prior Function            PT Goals (current goals can now be found in the care plan section) Progress towards PT goals: Progressing toward goals    Frequency       PT Plan Current plan remains appropriate    Co-evaluation             End of Session Equipment Utilized During Treatment: Gait belt Activity Tolerance: Patient tolerated treatment well Patient left: in chair;with call bell/phone within reach;with family/visitor present     Time: [id number] PT Time Calculation (min) (ACUTE ONLY): 19 min  Charges:  $Gait Training: 8-22 mins                    G Codes:      Melford Aase 2016-02-17, 7:43 AM Elwyn Reach, [place]

## 2016-01-22 NOTE — Op Note (Signed)
TOTAL KNEE REPLACEMENT OPERATIVE NOTE:  01/21/2016  1:59 PM  PATIENT:  Brianna Pineda  82 y.o. female  PRE-OPERATIVE DIAGNOSIS:  PRIMARY OSTEOARTHRITIS RIGHT KNEE  POST-OPERATIVE DIAGNOSIS:  PRIMARY OSTEOARTHRITIS RIGHT KNEE  PROCEDURE:  Procedure(s): RIGHT TOTAL KNEE ARTHROPLASTY  SURGEON:  Surgeon(s): Vickey Huger, MD  PHYSICIAN ASSISTANT: Carlynn Spry, [facility]  ANESTHESIA:   spinal  DRAINS: Hemovac  SPECIMEN: None  COUNTS:  Correct  TOURNIQUET:   Total Tourniquet Time Documented: Thigh (Right) - 43 minutes Total: Thigh (Right) - 43 minutes   DICTATION:  Indication for procedure:    The patient is a 82 y.o. female who has failed conservative treatment for PRIMARY OSTEOARTHRITIS RIGHT KNEE.  Informed consent was obtained prior to anesthesia. The risks versus benefits of the operation were explain and in a way the patient can, and did, understand.   On the implant demand matching protocol, this patient scored 8.  Therefore, this patient did" "did not receive a polyethylene insert with vitamin E which is a high demand implant.  Description of procedure:     The patient was taken to the operating room and placed under anesthesia.  The patient was positioned in the usual fashion taking care that all body parts were adequately padded and/or protected.  I foley catheter was not placed.  A tourniquet was applied and the leg prepped and draped in the usual sterile fashion.  The extremity was exsanguinated with the esmarch and tourniquet inflated to 350 mmHg.  Pre-operative range of motion was normal.  The knee was in 5 degree of mild valgus.  A midline incision approximately 6-7 inches long was made with a #10 blade.  A new blade was used to make a parapatellar arthrotomy going 2-3 cm into the quadriceps tendon, over the patella, and alongside the medial aspect of the patellar tendon.  A synovectomy was then performed with the #10 blade and forceps. I then elevated the deep MCL off the  medial tibial metaphysis subperiosteally around to the semimembranosus attachment.    I everted the patella and used calipers to measure patellar thickness.  I used the reamer to ream down to appropriate thickness to recreate the native thickness.  I then removed excess bone with the rongeur and sagittal saw.  I used the appropriately sized template and drilled the three lug holes.  I then put the trial in place and measured the thickness with the calipers to ensure recreation of the native thickness.  The trial was then removed and the patella subluxed and the knee brought into flexion.  A homan retractor was place to retract and protect the patella and lateral structures.  A Z-retractor was place medially to protect the medial structures.  The extra-medullary alignment system was used to make cut the tibial articular surface perpendicular to the anamotic axis of the tibia and in 3 degrees of posterior slope.  The cut surface and alignment jig was removed.  I then used the intramedullary alignment guide to make a 4 valgus cut on the distal femur.  I then marked out the epicondylar axis on the distal femur.  The posterior condylar axis measured 6 degrees.  I then used the anterior referencing sizer and measured the femur to be a size 6.  The 4-In-1 cutting block was screwed into place in external rotation matching the posterior condylar angle, making our cuts perpendicular to the epicondylar axis.  Anterior, posterior and chamfer cuts were made with the sagittal saw.  The cutting block and cut pieces  were removed.  A lamina spreader was placed in 90 degrees of flexion.  The ACL, PCL, menisci, and posterior condylar osteophytes were removed.  A 10 mm spacer blocked was found to offer good flexion and extension gap balance after minimal in degree releasing.   The scoop retractor was then placed and the femoral finishing block was pinned in place.  The small sagittal saw was used as well as the lug drill to  finish the femur.  The block and cut surfaces were removed and the medullary canal hole filled with autograft bone from the cut pieces.  The tibia was delivered forward in deep flexion and external rotation.  A size D tray was selected and pinned into place centered on the medial 1/3 of the tibial tubercle.  The reamer and keel was used to prepare the tibia through the tray.    I then trialed with the size 6 femur, size D tibia, a 10 mm insert and the 32 patella.  I had excellent flexion/extension gap balance, excellent patella tracking.  Flexion was full and beyond 120 degrees; extension was zero.  These components were chosen and the staff opened them to me on the back table while the knee was lavaged copiously and the cement mixed.  The soft tissue was infiltrated with 60cc of exparel 1.3% through a 21 gauge needle.  I cemented in the components and removed all excess cement.  The polyethylene tibial component was snapped into place and the knee placed in extension while cement was hardening.  The capsule was infilltrated with 30cc of .25% Marcaine with epinephrine.  A hemovac was place in the joint exiting superolaterally.  A pain pump was place superomedially superficial to the arthrotomy.  Once the cement was hard, the tourniquet was let down.  Hemostasis was obtained.  The arthrotomy was closed with figure-8 #1 vicryl sutures.  The deep soft tissues were closed with #0 vicryls and the subcuticular layer closed with a running #2-0 vicryl.  The skin was reapproximated and closed with skin staples.  The wound was dressed with xeroform, 4 x4's, 2 ABD sponges, a single layer of webril and a TED stocking.   The patient was then awakened, extubated, and taken to the recovery room in stable condition.  BLOOD LOSS:  300cc DRAINS: 1 hemovac, 1 pain catheter COMPLICATIONS:  None.  PLAN OF CARE: Admit to inpatient   PATIENT DISPOSITION:  PACU - hemodynamically stable.   Delay start of Pharmacological  VTE agent (>24hrs) due to surgical blood loss or risk of bleeding:  not applicable  Please fax a copy of this op note to my office at [phone] (please only include page 1 and 2 of the Case Information op note)

## 2016-01-22 NOTE — Evaluation (Signed)
Occupational Therapy Evaluation Patient Details Name: Brianna Pineda MRN: [id number] DOB: 01-30-1932 Today's Date: 01/22/2016    History of Present Illness Pt admitted for R TKA. PMHx: dementia, SOB, depression   Clinical Impression   Patient presenting with decreased I in self care, decreased balance, and decreased functional mobility/transfers. Patient reports being mod I PTA. Patient currently functioning at supervision - mod A ( LB self care). Patient will benefit from acute OT to increase overall independence in the areas of ADLs, functional mobility, and safety in order to safely discharge home.    Follow Up Recommendations  No OT follow up;Supervision/Assistance - 24 hour    Equipment Recommendations  3 in 1 bedside comode    Recommendations for Other Services       Precautions / Restrictions Precautions Precautions: Knee Restrictions Weight Bearing Restrictions: Yes RLE Weight Bearing: Weight bearing as tolerated      Mobility Bed Mobility Overal bed mobility: Modified Independent      General bed mobility comments: bed flat no rail for supine to sit and sit to supine  Transfers Overall transfer level: Needs assistance   Transfers: Sit to/from Stand Sit to Stand: Supervision         General transfer comment: cues for hand placement     Balance Overall balance assessment: Needs assistance Sitting-balance support: No upper extremity supported;Feet supported Sitting balance-Leahy Scale: Good     Standing balance support: During functional activity;No upper extremity supported Standing balance-Leahy Scale: Fair          ADL Overall ADL's : Needs assistance/impaired     Grooming: Sitting;Set up   Upper Body Bathing: Set up;Sitting   Lower Body Bathing: Moderate assistance;Sit to/from stand   Upper Body Dressing : Set up;Sitting   Lower Body Dressing: Moderate assistance;Sit to/from stand   Toilet Transfer: Min guard;RW;Comfort height toilet    Toileting- Water quality scientist and Hygiene: Min guard;Sit to/from stand         General ADL Comments: Pt supine in bed upon entering the room but agreeable to OT intervention this session. Pt performed sit <>stand from bed with supervision. Pt performed ambulated with RW into bathroom with close supervision and verbal cues for posture. Pt performed transfer onto toilet with steady assistance for safety. Min guard when standing for clothing management and hygiene as well.                Pertinent Vitals/Pain Pain Assessment: 0-10 Pain Score: 6  Pain Location: R knee Pain Descriptors / Indicators: Aching;Sore Pain Intervention(s): Limited activity within patient's tolerance;Monitored during session;Repositioned;RN gave pain meds during session     Hand Dominance Right   Extremity/Trunk Assessment Upper Extremity Assessment Upper Extremity Assessment: Overall WFL for tasks assessed   Lower Extremity Assessment Lower Extremity Assessment: Defer to PT evaluation   Cervical / Trunk Assessment Cervical / Trunk Assessment: Normal   Communication Communication Communication: No difficulties   Cognition Arousal/Alertness: Awake/alert Behavior During Therapy: WFL for tasks assessed/performed Overall Cognitive Status: Within Functional Limits for tasks assessed                                Home Living Family/patient expects to be discharged to:: Private residence Living Arrangements: Spouse/significant other Available Help at Discharge: Family;Available 24 hours/day Type of Home: House Home Access: Level entry     Home Layout: One level     Bathroom Shower/Tub: Occupational psychologist: Standard  Home Equipment: [place] - 2 wheels;Cane - quad;Bedside commode   Additional Comments: Pt lives in basement apartment at son's home. It has level entry.      Prior Functioning/Environment Level of Independence: Independent with assistive device(s)         Comments: drives "every now and then"    OT Diagnosis: Acute pain   OT Problem List: Decreased strength;Decreased knowledge of use of DME or AE;Decreased knowledge of precautions;Decreased activity tolerance;Cardiopulmonary status limiting activity;Pain;Impaired balance (sitting and/or standing);Decreased safety awareness   OT Treatment/Interventions: Self-care/ADL training;Therapeutic exercise;Energy conservation;DME and/or AE instruction;Patient/family education;Therapeutic activities;Balance training    OT Goals(Current goals can be found in the care plan section) Acute Rehab OT Goals Patient Stated Goal: walk without pain and return home when I'm ready OT Goal Formulation: With patient Time For Goal Achievement: 02/05/16 Potential to Achieve Goals: Fair ADL Goals Pt Will Perform Upper Body Bathing: with supervision;sitting Pt Will Perform Lower Body Bathing: with supervision;sit to/from stand Pt Will Perform Upper Body Dressing: with supervision;sitting Pt Will Perform Lower Body Dressing: with supervision;with adaptive equipment;sit to/from stand Pt Will Transfer to Toilet: with supervision;ambulating;bedside commode Pt Will Perform Toileting - Clothing Manipulation and hygiene: with supervision;sit to/from stand Pt Will Perform Tub/Shower Transfer: Shower transfer;3 in 1;rolling walker;with supervision  OT Frequency: Min 2X/week   Barriers to D/C: Other (comment)  none known at this time          End of Session Equipment Utilized During Treatment: Rolling walker CPM Right Knee CPM Right Knee: Off  Activity Tolerance: Patient tolerated treatment well Patient left: in bed;with call bell/phone within reach;with family/visitor present   Time: 1027-1050 OT Time Calculation (min): 23 min Charges:  OT General Charges $OT Visit: 1 Procedure OT Evaluation $OT Eval Moderate Complexity: 1 Procedure OT Treatments $Self Care/Home Management : 8-22 mins G-Codes:     Phineas Semen, MS, OTR/L 01/22/2016, 11:52 AM

## 2016-01-22 NOTE — Progress Notes (Signed)
Physical Therapy Treatment Patient Details Name: Brianna Pineda MRN: [id number] DOB: Dec 07, 1932 Today's Date: 01/22/2016    History of Present Illness Pt admitted for R TKA. PMHx: dementia, SOB, depression    PT Comments    Pt continues to progress well with HEP and gait. Pt able to increase to 300' ambulation and reaching 80 degrees AAROM for flexion. Pt and spouse educated for walking and HEP routine for home as well as continued CPM use. Bone foam in use end of session. All questions answered and education completed. Pt safe for D/C home  Follow Up Recommendations  Home health PT     Equipment Recommendations       Recommendations for Other Services       Precautions / Restrictions Precautions Precautions: Knee Restrictions RLE Weight Bearing: Weight bearing as tolerated    Mobility  Bed Mobility Overal bed mobility: Modified Independent             General bed mobility comments: bed flat no rail for supine to sit and sit to supine  Transfers Overall transfer level: Needs assistance   Transfers: Sit to/from Stand Sit to Stand: Supervision         General transfer comment: cues for hand placement   Ambulation/Gait Ambulation/Gait assistance: Supervision Ambulation Distance (Feet): 300 Feet Assistive device: Rolling walker (2 wheeled) Gait Pattern/deviations: Step-through pattern;Decreased stride length   Gait velocity interpretation: Below normal speed for age/gender General Gait Details: cues for position in RW and looking up as well as increased dorsiflexion right   Stairs            Wheelchair Mobility    Modified Rankin (Stroke Patients Only)       Balance                                    Cognition Arousal/Alertness: Awake/alert Behavior During Therapy: WFL for tasks assessed/performed Overall Cognitive Status: Within Functional Limits for tasks assessed                      Exercises Total Joint  Exercises  Heel Slides: AAROM;Right;Supine;15 reps Hip ABduction/ADduction: AROM;Right;Supine;15 reps Straight Leg Raises: AROM;Right;Supine;15 reps Long Arc Quad: AROM;Seated;Right;15 reps Goniometric ROM: 2-80    General Comments        Pertinent Vitals/Pain Pain Score: 3  Pain Location: right knee Pain Descriptors / Indicators: Aching Pain Intervention(s): Limited activity within patient's tolerance;Monitored during session;Repositioned    Home Living                      Prior Function            PT Goals (current goals can now be found in the care plan section) Progress towards PT goals: Progressing toward goals    Frequency       PT Plan Current plan remains appropriate    Co-evaluation             End of Session Equipment Utilized During Treatment: Gait belt Activity Tolerance: Patient tolerated treatment well Patient left: in bed;with call bell/phone within reach;with family/visitor present;with nursing/sitter in room     Time: [id number] PT Time Calculation (min) (ACUTE ONLY): 19 min  Charges:  $Gait Training: 8-22 mins                    G Codes:      Lanetta Inch  Beth 01/22/2016, 11:24 AM Elwyn Reach, [place]

## 2016-01-22 NOTE — Progress Notes (Signed)
SPORTS MEDICINE AND JOINT REPLACEMENT  Lara Mulch, MD   Carlynn Spry, PA-C [address], [place], [place]  [id number]                             [phone]   PROGRESS NOTE  Subjective:  negative for Chest Pain  negative for Shortness of Breath  negative for Nausea/Vomiting   negative for Calf Pain  negative for Bowel Movement   Tolerating Diet: yes         Patient reports pain as 3 on 0-10 scale.    Objective: Vital signs in last 24 hours:   Patient Vitals for the past 24 hrs:  BP Temp Temp src Pulse Resp SpO2  01/22/16 0532 (!) 147/62 mmHg 97.6 F (36.4 C) Oral 73 20 95 %  01/22/16 0225 (!) 119/92 mmHg 98.1 F (36.7 C) Oral 74 18 96 %  01/21/16 2210 (!) 144/58 mmHg 98.1 F (36.7 C) Oral 75 16 94 %  01/21/16 1119 (!) 162/70 mmHg - - 67 14 -  01/21/16 1100 133/79 mmHg 99 F (37.2 C) - - - -  01/21/16 1045 138/79 mmHg - - 73 19 95 %  01/21/16 1030 - - - 66 17 96 %  01/21/16 1023 140/78 mmHg - - 73 13 96 %  01/21/16 1015 - - - 71 12 96 %  01/21/16 1008 139/77 mmHg - - 75 15 98 %  01/21/16 1000 - - - 66 10 96 %  01/21/16 0945 132/70 mmHg - - 74 14 95 %  01/21/16 0930 127/70 mmHg - - 77 18 93 %  01/21/16 0907 115/62 mmHg 97.5 F (36.4 C) - 81 (!) 8 96 %    @flow {1959:LAST@   Intake/Output from previous day:   02/06 0701 - 02/07 0700 In: 2457.5 [P.O.:360; I.V.:2047.5] Out: 100    Intake/Output this shift:       Intake/Output      02/06 0701 - 02/07 0700 02/07 0701 - 02/08 0700   P.O. 360    I.V. 2047.5    IV Piggyback 50    Total Intake 2457.5     Urine 0    Blood 100    Total Output 100     Net +2357.5          Urine Occurrence 3 x       LABORATORY DATA:  Recent Labs  01/21/16 1200 01/22/16 0300  WBC 5.3 11.7*  HGB 13.6 11.7*  HCT 41.2 36.8  PLT 163 162    Recent Labs  01/21/16 1200 01/22/16 0300  NA  --  139  K  --  4.1  CL  --  104  CO2  --  26  BUN  --  16  CREATININE 0.91 0.83  GLUCOSE  --  136*  CALCIUM  --  8.6*    Lab Results  Component Value Date   INR 1.05 01/10/2016    Examination:  General appearance: alert, cooperative and no distress Extremities: Homans sign is negative, no sign of DVT  Wound Exam: clean, dry, intact   Drainage:  None: wound tissue dry  Motor Exam: Quadriceps and Hamstrings Intact  Sensory Exam: Deep Peroneal normal   Assessment:    1 Day Post-Op  Procedure(s) (LRB): RIGHT TOTAL KNEE ARTHROPLASTY (Right)  ADDITIONAL DIAGNOSIS:  Active Problems:   S/P total knee arthroplasty  Acute Blood Loss Anemia   Plan: Physical Therapy as ordered  Weight Bearing as Tolerated (WBAT)  DVT Prophylaxis:  Lovenox  DISCHARGE PLAN: Home  DISCHARGE NEEDS: HHPT, CPM, Walker and 3-in-1 comode seat         Mathayus Stanbery 01/22/2016, 7:15 AM

## 2016-01-22 NOTE — Progress Notes (Signed)
Brianna Pineda discharged home per MD order. Discharge instructions reviewed and discussed with patient. All questions and concerns answered. Copy of instructions and scripts given to patient. No IV access.  Patient escorted to car by staff in a wheelchair. No distress noted upon discharge.   Esaw Dace 01/22/2016 3:59 PM

## 2016-01-23 DIAGNOSIS — G4730 Sleep apnea, unspecified: Secondary | ICD-10-CM | POA: Diagnosis not present

## 2016-01-23 DIAGNOSIS — Z7901 Long term (current) use of anticoagulants: Secondary | ICD-10-CM | POA: Diagnosis not present

## 2016-01-23 DIAGNOSIS — Z471 Aftercare following joint replacement surgery: Secondary | ICD-10-CM | POA: Diagnosis not present

## 2016-01-23 DIAGNOSIS — Z85828 Personal history of other malignant neoplasm of skin: Secondary | ICD-10-CM | POA: Diagnosis not present

## 2016-01-23 DIAGNOSIS — R69 Illness, unspecified: Secondary | ICD-10-CM | POA: Diagnosis not present

## 2016-01-23 DIAGNOSIS — Z96651 Presence of right artificial knee joint: Secondary | ICD-10-CM | POA: Diagnosis not present

## 2016-01-24 DIAGNOSIS — G4730 Sleep apnea, unspecified: Secondary | ICD-10-CM | POA: Diagnosis not present

## 2016-01-24 DIAGNOSIS — Z85828 Personal history of other malignant neoplasm of skin: Secondary | ICD-10-CM | POA: Diagnosis not present

## 2016-01-24 DIAGNOSIS — Z96651 Presence of right artificial knee joint: Secondary | ICD-10-CM | POA: Diagnosis not present

## 2016-01-24 DIAGNOSIS — R69 Illness, unspecified: Secondary | ICD-10-CM | POA: Diagnosis not present

## 2016-01-24 DIAGNOSIS — Z471 Aftercare following joint replacement surgery: Secondary | ICD-10-CM | POA: Diagnosis not present

## 2016-01-24 DIAGNOSIS — Z7901 Long term (current) use of anticoagulants: Secondary | ICD-10-CM | POA: Diagnosis not present

## 2016-01-26 DIAGNOSIS — R69 Illness, unspecified: Secondary | ICD-10-CM | POA: Diagnosis not present

## 2016-01-26 DIAGNOSIS — Z96651 Presence of right artificial knee joint: Secondary | ICD-10-CM | POA: Diagnosis not present

## 2016-01-26 DIAGNOSIS — Z7901 Long term (current) use of anticoagulants: Secondary | ICD-10-CM | POA: Diagnosis not present

## 2016-01-26 DIAGNOSIS — Z85828 Personal history of other malignant neoplasm of skin: Secondary | ICD-10-CM | POA: Diagnosis not present

## 2016-01-26 DIAGNOSIS — Z471 Aftercare following joint replacement surgery: Secondary | ICD-10-CM | POA: Diagnosis not present

## 2016-01-26 DIAGNOSIS — G4730 Sleep apnea, unspecified: Secondary | ICD-10-CM | POA: Diagnosis not present

## 2016-01-28 DIAGNOSIS — G4730 Sleep apnea, unspecified: Secondary | ICD-10-CM | POA: Diagnosis not present

## 2016-01-28 DIAGNOSIS — Z85828 Personal history of other malignant neoplasm of skin: Secondary | ICD-10-CM | POA: Diagnosis not present

## 2016-01-28 DIAGNOSIS — Z96651 Presence of right artificial knee joint: Secondary | ICD-10-CM | POA: Diagnosis not present

## 2016-01-28 DIAGNOSIS — R69 Illness, unspecified: Secondary | ICD-10-CM | POA: Diagnosis not present

## 2016-01-28 DIAGNOSIS — Z471 Aftercare following joint replacement surgery: Secondary | ICD-10-CM | POA: Diagnosis not present

## 2016-01-28 DIAGNOSIS — Z7901 Long term (current) use of anticoagulants: Secondary | ICD-10-CM | POA: Diagnosis not present

## 2016-01-28 NOTE — Discharge Summary (Signed)
SPORTS MEDICINE & JOINT REPLACEMENT   Lara Mulch, MD   Carlynn Spry, PA-C Carlyon Shadow, PA-C [place], [place],   [id number]                             [phone]  PATIENT ID: Brianna Pineda        MRN:  [id number]          DOB/AGE: 80-19-1933 / 82 y.o.    DISCHARGE SUMMARY  ADMISSION DATE:    01/21/2016 DISCHARGE DATE:   01/22/2016  ADMISSION DIAGNOSIS: PRIMARY OSTEOARTHRITIS RIGHT KNEE    DISCHARGE DIAGNOSIS:  PRIMARY OSTEOARTHRITIS RIGHT KNEE    ADDITIONAL DIAGNOSIS: Active Problems:   S/P total knee arthroplasty  Past Medical History  Diagnosis Date  . Dementia   . Shortness of breath dyspnea   . Depression   . Anxiety   . Arthritis   . Cancer ([place])     on nose,removed  . Sleep apnea     cpap    PROCEDURE: Procedure(s): RIGHT TOTAL KNEE ARTHROPLASTY on 01/21/2016  CONSULTS:     HISTORY:  See H&P in chart  HOSPITAL COURSE:  Brianna Pineda is a 82 y.o. admitted on 01/21/2016 and found to have a diagnosis of [place].  After appropriate laboratory studies were obtained  they were taken to the operating room on 01/21/2016 and underwent Procedure(s): RIGHT TOTAL KNEE ARTHROPLASTY.   They were given perioperative antibiotics:  Anti-infectives    Start     Dose/Rate Route Frequency Ordered Stop   01/21/16 1200  ceFAZolin (ANCEF) IVPB 2 g/50 mL premix     2 g 100 mL/hr over 30 Minutes Intravenous Every 6 hours 01/21/16 1117 01/21/16 2312   01/21/16 0700  ceFAZolin (ANCEF) IVPB 2 g/50 mL premix     2 g 100 mL/hr over 30 Minutes Intravenous To ShortStay Surgical 01/20/16 1506 01/21/16 0740    .  Patient given tranexamic acid IV or topical and exparel intra-operatively.  Tolerated the procedure well.    POD# 1: Vital signs were stable.  Patient denied Chest pain, shortness of breath, or calf pain.  Patient was started on Lovenox 30 mg subcutaneously twice daily at 8am.  Consults to PT, OT, and care management were made.  The patient  was weight bearing as tolerated.  CPM was placed on the operative leg 0-90 degrees for 6-8 hours a day. When out of the CPM, patient was placed in the foam block to achieve full extension. Incentive spirometry was taught.  Dressing was changed.       POD #2, Continued  PT for ambulation and exercise program.  IV saline locked.  O2 discontinued.    The remainder of the hospital course was dedicated to ambulation and strengthening.   The patient was discharged on 1 day post op in  Good condition.  Blood products given:none  DIAGNOSTIC STUDIES: Recent vital signs: No data found.      Recent laboratory studies:  Recent Labs  01/22/16 0300  WBC 11.7*  HGB 11.7*  HCT 36.8  PLT 162    Recent Labs  01/22/16 0300  NA 139  K 4.1  CL 104  CO2 26  BUN 16  CREATININE 0.83  GLUCOSE 136*  CALCIUM 8.6*   Lab Results  Component Value Date   INR 1.05 01/10/2016     Recent Radiographic Studies :  Dg Chest 2 View  01/10/2016  CLINICAL DATA:  Preoperative exam prior to knee related surgery, no known cardiopulmonary history EXAM: CHEST  2 VIEW COMPARISON:  None in PACs FINDINGS: There is subtle increased density projecting inferior to the anterior aspect of the left first rib but not clearly involving the rib. On the left just above the hemidiaphragm anteriorly there is subtle increased parenchymal density likely in the lingula. There is no pleural effusion. The heart and pulmonary vascularity are normal. The mediastinum is normal in width. The trachea is midline. There is mild degenerative disc disease of the thoracic spine. IMPRESSION: Parenchymal density in the left upper lobe and likely in the right middle lobe. The findings are nonspecific. Given the lack of any previous studies, chest CT scanning is recommended. Electronically Signed   By: David  Martinique M.D.   On: 01/10/2016 12:01   Ct Chest W Contrast  01/11/2016  CLINICAL DATA:  Abnormal chest x-ray. Cough and congestion for 1 month.  EXAM: CT CHEST WITH CONTRAST TECHNIQUE: Multidetector CT imaging of the chest was performed during intravenous contrast administration. CONTRAST:  69mL ISOVUE-300 IOPAMIDOL (ISOVUE-300) INJECTION 61% COMPARISON:  Chest x-ray 01/10/2016 FINDINGS: Mediastinum/Lymph Nodes: No masses, pathologically enlarged lymph nodes, or other significant abnormality. Lungs/Pleura: There is a 14 x 8 x 11 mm subpleural ground-glass opacity in the anterior segment of the left upper lobe (image 23 sequence 4). Occasional few mm calcified subpleural granulomata seen in the right and left lower lobes. Minimal lower lobe bronchiectasis. No significant emphysematous changes. Upper abdomen: No acute findings. Calcific granulomatous disease of the spleen is seen. Musculoskeletal: No chest wall mass or suspicious bone lesions identified. Multilevel osteoarthritic changes with mild kyphotic deformity of the thoracic spine are seen. IMPRESSION: 14 x 8 mm anterior segment of left upper lobe subpleural ground-glass opacity without a solid component. Differential diagnosis includes posttraumatic change, bronchoalveolar carcinoma, or inflammatory change. Follow-up CT of the chest in 3 months is recommended. No significant mediastinal or hilar lymphadenopathy. Electronically Signed   By: Fidela Salisbury M.D.   On: 01/11/2016 13:31    DISCHARGE INSTRUCTIONS: Discharge Instructions    CPM    Complete by:  As directed   Continuous passive motion machine (CPM):      Use the CPM from 0 to 90 for 6-8 hours per day.      You may increase by 10 per day.  You may break it up into 2 or 3 sessions per day.      Use CPM for 2 weeks or until you are told to stop.     Call MD / Call 911    Complete by:  As directed   If you experience chest pain or shortness of breath, CALL 911 and be transported to the hospital emergency room.  If you develope a fever above 101 F, pus (white drainage) or increased drainage or redness at the wound, or calf pain,  call your surgeon's office.     Change dressing    Complete by:  As directed   Change dressing on Wednesday, then change the dressing daily with sterile 4 x 4 inch gauze dressing and apply TED hose.     Constipation Prevention    Complete by:  As directed   Drink plenty of fluids.  Prune juice may be helpful.  You may use a stool softener, such as Colace (over the counter) 100 mg twice a day.  Use MiraLax (over the counter) for constipation as needed.     Diet - low sodium  heart healthy    Complete by:  As directed      Do not put a pillow under the knee. Place it under the heel.    Complete by:  As directed      Driving restrictions    Complete by:  As directed   No driving for 6 weeks     Increase activity slowly as tolerated    Complete by:  As directed      Lifting restrictions    Complete by:  As directed   No lifting for 6 weeks     TED hose    Complete by:  As directed   Use stockings (TED hose) for 2 weeks on both leg(s).  You may remove them at night for sleeping.           DISCHARGE MEDICATIONS:     Medication List    TAKE these medications        enoxaparin 40 MG/0.4ML injection  Commonly known as:  LOVENOX  Inject 0.4 mLs (40 mg total) into the skin daily.     methocarbamol 500 MG tablet  Commonly known as:  ROBAXIN  Take 1-2 tablets (500-1,000 mg total) by mouth every 6 (six) hours as needed for muscle spasms.     multivitamin with minerals tablet  Take 1 tablet by mouth daily.     NAMENDA XR 28 MG Cp24 24 hr capsule  Generic drug:  memantine  Take 28 mg by mouth daily.     oxyCODONE 5 MG immediate release tablet  Commonly known as:  Oxy IR/ROXICODONE  Take 1-2 tablets (5-10 mg total) by mouth every 3 (three) hours as needed for breakthrough pain.     oxyCODONE 10 mg 12 hr tablet  Commonly known as:  OXYCONTIN  Take 1 tablet (10 mg total) by mouth every 12 (twelve) hours.     rivastigmine 4.5 MG capsule  Commonly known as:  EXELON  Take 4.5 mg by  mouth 2 (two) times daily.     venlafaxine XR 75 MG 24 hr capsule  Commonly known as:  EFFEXOR-XR  Take 75 mg by mouth daily with breakfast.     Vitamin D (Ergocalciferol) 50000 units Caps capsule  Commonly known as:  DRISDOL  Take 50,000 Units by mouth every 7 (seven) days. Friday        FOLLOW UP VISIT:       Follow-up Information    Follow up with [facility].   Why:  They will contact you to schedule home therapy visits.   Contact information:   [address] [place] [address] [phone]       Follow up with Rudean Haskell, MD. Call on 02/05/2016.   Specialty:  Orthopedic Surgery   Contact information:   [place] [address] [phone]       DISPOSITION: HOME   CONDITION:  Good   Ferry Matthis 01/28/2016, 8:50 PM

## 2016-01-30 DIAGNOSIS — Z471 Aftercare following joint replacement surgery: Secondary | ICD-10-CM | POA: Diagnosis not present

## 2016-01-30 DIAGNOSIS — R69 Illness, unspecified: Secondary | ICD-10-CM | POA: Diagnosis not present

## 2016-01-30 DIAGNOSIS — G4730 Sleep apnea, unspecified: Secondary | ICD-10-CM | POA: Diagnosis not present

## 2016-01-30 DIAGNOSIS — Z96651 Presence of right artificial knee joint: Secondary | ICD-10-CM | POA: Diagnosis not present

## 2016-01-30 DIAGNOSIS — Z7901 Long term (current) use of anticoagulants: Secondary | ICD-10-CM | POA: Diagnosis not present

## 2016-01-30 DIAGNOSIS — Z85828 Personal history of other malignant neoplasm of skin: Secondary | ICD-10-CM | POA: Diagnosis not present

## 2016-02-01 DIAGNOSIS — R69 Illness, unspecified: Secondary | ICD-10-CM | POA: Diagnosis not present

## 2016-02-01 DIAGNOSIS — Z471 Aftercare following joint replacement surgery: Secondary | ICD-10-CM | POA: Diagnosis not present

## 2016-02-01 DIAGNOSIS — Z85828 Personal history of other malignant neoplasm of skin: Secondary | ICD-10-CM | POA: Diagnosis not present

## 2016-02-01 DIAGNOSIS — G4730 Sleep apnea, unspecified: Secondary | ICD-10-CM | POA: Diagnosis not present

## 2016-02-01 DIAGNOSIS — Z96651 Presence of right artificial knee joint: Secondary | ICD-10-CM | POA: Diagnosis not present

## 2016-02-01 DIAGNOSIS — Z7901 Long term (current) use of anticoagulants: Secondary | ICD-10-CM | POA: Diagnosis not present

## 2016-02-04 DIAGNOSIS — R69 Illness, unspecified: Secondary | ICD-10-CM | POA: Diagnosis not present

## 2016-02-04 DIAGNOSIS — Z85828 Personal history of other malignant neoplasm of skin: Secondary | ICD-10-CM | POA: Diagnosis not present

## 2016-02-04 DIAGNOSIS — Z471 Aftercare following joint replacement surgery: Secondary | ICD-10-CM | POA: Diagnosis not present

## 2016-02-04 DIAGNOSIS — Z96651 Presence of right artificial knee joint: Secondary | ICD-10-CM | POA: Diagnosis not present

## 2016-02-04 DIAGNOSIS — G4730 Sleep apnea, unspecified: Secondary | ICD-10-CM | POA: Diagnosis not present

## 2016-02-04 DIAGNOSIS — Z7901 Long term (current) use of anticoagulants: Secondary | ICD-10-CM | POA: Diagnosis not present

## 2016-02-05 DIAGNOSIS — M25561 Pain in right knee: Secondary | ICD-10-CM | POA: Diagnosis not present

## 2016-02-05 DIAGNOSIS — M25461 Effusion, right knee: Secondary | ICD-10-CM | POA: Diagnosis not present

## 2016-02-05 DIAGNOSIS — Z96651 Presence of right artificial knee joint: Secondary | ICD-10-CM | POA: Diagnosis not present

## 2016-02-06 DIAGNOSIS — M25561 Pain in right knee: Secondary | ICD-10-CM | POA: Diagnosis not present

## 2016-02-06 DIAGNOSIS — M25461 Effusion, right knee: Secondary | ICD-10-CM | POA: Diagnosis not present

## 2016-02-06 DIAGNOSIS — M62551 Muscle wasting and atrophy, not elsewhere classified, right thigh: Secondary | ICD-10-CM | POA: Diagnosis not present

## 2016-02-06 DIAGNOSIS — Z96651 Presence of right artificial knee joint: Secondary | ICD-10-CM | POA: Diagnosis not present

## 2016-02-08 DIAGNOSIS — M62551 Muscle wasting and atrophy, not elsewhere classified, right thigh: Secondary | ICD-10-CM | POA: Diagnosis not present

## 2016-02-08 DIAGNOSIS — M25561 Pain in right knee: Secondary | ICD-10-CM | POA: Diagnosis not present

## 2016-02-08 DIAGNOSIS — M25461 Effusion, right knee: Secondary | ICD-10-CM | POA: Diagnosis not present

## 2016-02-08 DIAGNOSIS — Z96651 Presence of right artificial knee joint: Secondary | ICD-10-CM | POA: Diagnosis not present

## 2016-02-11 DIAGNOSIS — M62551 Muscle wasting and atrophy, not elsewhere classified, right thigh: Secondary | ICD-10-CM | POA: Diagnosis not present

## 2016-02-11 DIAGNOSIS — Z96651 Presence of right artificial knee joint: Secondary | ICD-10-CM | POA: Diagnosis not present

## 2016-02-11 DIAGNOSIS — M25561 Pain in right knee: Secondary | ICD-10-CM | POA: Diagnosis not present

## 2016-02-11 DIAGNOSIS — M25461 Effusion, right knee: Secondary | ICD-10-CM | POA: Diagnosis not present

## 2016-02-13 DIAGNOSIS — M62551 Muscle wasting and atrophy, not elsewhere classified, right thigh: Secondary | ICD-10-CM | POA: Diagnosis not present

## 2016-02-13 DIAGNOSIS — Z96651 Presence of right artificial knee joint: Secondary | ICD-10-CM | POA: Diagnosis not present

## 2016-02-13 DIAGNOSIS — M25561 Pain in right knee: Secondary | ICD-10-CM | POA: Diagnosis not present

## 2016-02-13 DIAGNOSIS — M25461 Effusion, right knee: Secondary | ICD-10-CM | POA: Diagnosis not present

## 2016-02-15 DIAGNOSIS — M25461 Effusion, right knee: Secondary | ICD-10-CM | POA: Diagnosis not present

## 2016-02-15 DIAGNOSIS — M25561 Pain in right knee: Secondary | ICD-10-CM | POA: Diagnosis not present

## 2016-02-15 DIAGNOSIS — M62551 Muscle wasting and atrophy, not elsewhere classified, right thigh: Secondary | ICD-10-CM | POA: Diagnosis not present

## 2016-02-15 DIAGNOSIS — Z96651 Presence of right artificial knee joint: Secondary | ICD-10-CM | POA: Diagnosis not present

## 2016-02-18 DIAGNOSIS — Z96651 Presence of right artificial knee joint: Secondary | ICD-10-CM | POA: Diagnosis not present

## 2016-02-18 DIAGNOSIS — M25561 Pain in right knee: Secondary | ICD-10-CM | POA: Diagnosis not present

## 2016-02-18 DIAGNOSIS — M62551 Muscle wasting and atrophy, not elsewhere classified, right thigh: Secondary | ICD-10-CM | POA: Diagnosis not present

## 2016-02-18 DIAGNOSIS — M25461 Effusion, right knee: Secondary | ICD-10-CM | POA: Diagnosis not present

## 2016-02-20 DIAGNOSIS — Z96651 Presence of right artificial knee joint: Secondary | ICD-10-CM | POA: Diagnosis not present

## 2016-02-20 DIAGNOSIS — M25461 Effusion, right knee: Secondary | ICD-10-CM | POA: Diagnosis not present

## 2016-02-20 DIAGNOSIS — M62551 Muscle wasting and atrophy, not elsewhere classified, right thigh: Secondary | ICD-10-CM | POA: Diagnosis not present

## 2016-02-20 DIAGNOSIS — M25561 Pain in right knee: Secondary | ICD-10-CM | POA: Diagnosis not present

## 2016-02-22 DIAGNOSIS — Z96651 Presence of right artificial knee joint: Secondary | ICD-10-CM | POA: Diagnosis not present

## 2016-02-22 DIAGNOSIS — M25461 Effusion, right knee: Secondary | ICD-10-CM | POA: Diagnosis not present

## 2016-02-22 DIAGNOSIS — M25561 Pain in right knee: Secondary | ICD-10-CM | POA: Diagnosis not present

## 2016-02-22 DIAGNOSIS — M62551 Muscle wasting and atrophy, not elsewhere classified, right thigh: Secondary | ICD-10-CM | POA: Diagnosis not present

## 2016-02-25 DIAGNOSIS — M25561 Pain in right knee: Secondary | ICD-10-CM | POA: Diagnosis not present

## 2016-02-25 DIAGNOSIS — Z96651 Presence of right artificial knee joint: Secondary | ICD-10-CM | POA: Diagnosis not present

## 2016-02-25 DIAGNOSIS — M25461 Effusion, right knee: Secondary | ICD-10-CM | POA: Diagnosis not present

## 2016-02-25 DIAGNOSIS — M62551 Muscle wasting and atrophy, not elsewhere classified, right thigh: Secondary | ICD-10-CM | POA: Diagnosis not present

## 2016-02-27 DIAGNOSIS — M25561 Pain in right knee: Secondary | ICD-10-CM | POA: Diagnosis not present

## 2016-02-27 DIAGNOSIS — M25461 Effusion, right knee: Secondary | ICD-10-CM | POA: Diagnosis not present

## 2016-02-27 DIAGNOSIS — M62551 Muscle wasting and atrophy, not elsewhere classified, right thigh: Secondary | ICD-10-CM | POA: Diagnosis not present

## 2016-02-27 DIAGNOSIS — Z96651 Presence of right artificial knee joint: Secondary | ICD-10-CM | POA: Diagnosis not present

## 2016-02-28 DIAGNOSIS — Z96651 Presence of right artificial knee joint: Secondary | ICD-10-CM | POA: Diagnosis not present

## 2016-02-28 DIAGNOSIS — M25461 Effusion, right knee: Secondary | ICD-10-CM | POA: Diagnosis not present

## 2016-02-28 DIAGNOSIS — M25561 Pain in right knee: Secondary | ICD-10-CM | POA: Diagnosis not present

## 2016-02-28 DIAGNOSIS — M62551 Muscle wasting and atrophy, not elsewhere classified, right thigh: Secondary | ICD-10-CM | POA: Diagnosis not present

## 2016-03-03 DIAGNOSIS — M25461 Effusion, right knee: Secondary | ICD-10-CM | POA: Diagnosis not present

## 2016-03-03 DIAGNOSIS — Z96651 Presence of right artificial knee joint: Secondary | ICD-10-CM | POA: Diagnosis not present

## 2016-03-03 DIAGNOSIS — M62551 Muscle wasting and atrophy, not elsewhere classified, right thigh: Secondary | ICD-10-CM | POA: Diagnosis not present

## 2016-03-03 DIAGNOSIS — M25561 Pain in right knee: Secondary | ICD-10-CM | POA: Diagnosis not present

## 2016-03-07 DIAGNOSIS — M62551 Muscle wasting and atrophy, not elsewhere classified, right thigh: Secondary | ICD-10-CM | POA: Diagnosis not present

## 2016-03-07 DIAGNOSIS — M25461 Effusion, right knee: Secondary | ICD-10-CM | POA: Diagnosis not present

## 2016-03-07 DIAGNOSIS — Z96651 Presence of right artificial knee joint: Secondary | ICD-10-CM | POA: Diagnosis not present

## 2016-03-07 DIAGNOSIS — M25561 Pain in right knee: Secondary | ICD-10-CM | POA: Diagnosis not present

## 2016-03-11 DIAGNOSIS — M62551 Muscle wasting and atrophy, not elsewhere classified, right thigh: Secondary | ICD-10-CM | POA: Diagnosis not present

## 2016-03-11 DIAGNOSIS — M25461 Effusion, right knee: Secondary | ICD-10-CM | POA: Diagnosis not present

## 2016-03-11 DIAGNOSIS — Z96651 Presence of right artificial knee joint: Secondary | ICD-10-CM | POA: Diagnosis not present

## 2016-03-11 DIAGNOSIS — M25561 Pain in right knee: Secondary | ICD-10-CM | POA: Diagnosis not present

## 2016-03-14 DIAGNOSIS — M62551 Muscle wasting and atrophy, not elsewhere classified, right thigh: Secondary | ICD-10-CM | POA: Diagnosis not present

## 2016-03-14 DIAGNOSIS — M25561 Pain in right knee: Secondary | ICD-10-CM | POA: Diagnosis not present

## 2016-03-14 DIAGNOSIS — Z96651 Presence of right artificial knee joint: Secondary | ICD-10-CM | POA: Diagnosis not present

## 2016-03-14 DIAGNOSIS — M25461 Effusion, right knee: Secondary | ICD-10-CM | POA: Diagnosis not present

## 2016-03-17 DIAGNOSIS — R7301 Impaired fasting glucose: Secondary | ICD-10-CM | POA: Diagnosis not present

## 2016-03-17 DIAGNOSIS — G309 Alzheimer's disease, unspecified: Secondary | ICD-10-CM | POA: Diagnosis not present

## 2016-03-17 DIAGNOSIS — Z1389 Encounter for screening for other disorder: Secondary | ICD-10-CM | POA: Diagnosis not present

## 2016-03-17 DIAGNOSIS — E559 Vitamin D deficiency, unspecified: Secondary | ICD-10-CM | POA: Diagnosis not present

## 2016-03-17 DIAGNOSIS — Z6832 Body mass index (BMI) 32.0-32.9, adult: Secondary | ICD-10-CM | POA: Diagnosis not present

## 2016-03-17 DIAGNOSIS — R69 Illness, unspecified: Secondary | ICD-10-CM | POA: Diagnosis not present

## 2016-03-17 DIAGNOSIS — E785 Hyperlipidemia, unspecified: Secondary | ICD-10-CM | POA: Diagnosis not present

## 2016-03-18 DIAGNOSIS — M62551 Muscle wasting and atrophy, not elsewhere classified, right thigh: Secondary | ICD-10-CM | POA: Diagnosis not present

## 2016-03-18 DIAGNOSIS — Z96651 Presence of right artificial knee joint: Secondary | ICD-10-CM | POA: Diagnosis not present

## 2016-03-18 DIAGNOSIS — M25461 Effusion, right knee: Secondary | ICD-10-CM | POA: Diagnosis not present

## 2016-03-18 DIAGNOSIS — M25561 Pain in right knee: Secondary | ICD-10-CM | POA: Diagnosis not present

## 2016-03-21 DIAGNOSIS — M62551 Muscle wasting and atrophy, not elsewhere classified, right thigh: Secondary | ICD-10-CM | POA: Diagnosis not present

## 2016-03-21 DIAGNOSIS — M25561 Pain in right knee: Secondary | ICD-10-CM | POA: Diagnosis not present

## 2016-03-21 DIAGNOSIS — Z96651 Presence of right artificial knee joint: Secondary | ICD-10-CM | POA: Diagnosis not present

## 2016-03-21 DIAGNOSIS — M25461 Effusion, right knee: Secondary | ICD-10-CM | POA: Diagnosis not present

## 2016-04-01 DIAGNOSIS — M62551 Muscle wasting and atrophy, not elsewhere classified, right thigh: Secondary | ICD-10-CM | POA: Diagnosis not present

## 2016-04-01 DIAGNOSIS — Z96651 Presence of right artificial knee joint: Secondary | ICD-10-CM | POA: Diagnosis not present

## 2016-04-01 DIAGNOSIS — M25461 Effusion, right knee: Secondary | ICD-10-CM | POA: Diagnosis not present

## 2016-04-01 DIAGNOSIS — M25561 Pain in right knee: Secondary | ICD-10-CM | POA: Diagnosis not present

## 2016-04-07 DIAGNOSIS — G4733 Obstructive sleep apnea (adult) (pediatric): Secondary | ICD-10-CM | POA: Diagnosis not present

## 2016-04-29 DIAGNOSIS — R918 Other nonspecific abnormal finding of lung field: Secondary | ICD-10-CM | POA: Diagnosis not present

## 2016-04-29 DIAGNOSIS — Z6832 Body mass index (BMI) 32.0-32.9, adult: Secondary | ICD-10-CM | POA: Diagnosis not present

## 2016-05-05 DIAGNOSIS — R918 Other nonspecific abnormal finding of lung field: Secondary | ICD-10-CM | POA: Diagnosis not present

## 2016-09-15 DIAGNOSIS — E785 Hyperlipidemia, unspecified: Secondary | ICD-10-CM | POA: Diagnosis not present

## 2016-09-15 DIAGNOSIS — G309 Alzheimer's disease, unspecified: Secondary | ICD-10-CM | POA: Diagnosis not present

## 2016-09-15 DIAGNOSIS — R7301 Impaired fasting glucose: Secondary | ICD-10-CM | POA: Diagnosis not present

## 2016-09-15 DIAGNOSIS — E559 Vitamin D deficiency, unspecified: Secondary | ICD-10-CM | POA: Diagnosis not present

## 2016-09-15 DIAGNOSIS — R69 Illness, unspecified: Secondary | ICD-10-CM | POA: Diagnosis not present

## 2016-09-15 DIAGNOSIS — M8589 Other specified disorders of bone density and structure, multiple sites: Secondary | ICD-10-CM | POA: Diagnosis not present

## 2016-09-15 DIAGNOSIS — Z23 Encounter for immunization: Secondary | ICD-10-CM | POA: Diagnosis not present

## 2016-09-15 DIAGNOSIS — R918 Other nonspecific abnormal finding of lung field: Secondary | ICD-10-CM | POA: Diagnosis not present

## 2016-09-15 DIAGNOSIS — Z6834 Body mass index (BMI) 34.0-34.9, adult: Secondary | ICD-10-CM | POA: Diagnosis not present

## 2016-09-18 DIAGNOSIS — R918 Other nonspecific abnormal finding of lung field: Secondary | ICD-10-CM | POA: Diagnosis not present

## 2016-10-13 DIAGNOSIS — M85851 Other specified disorders of bone density and structure, right thigh: Secondary | ICD-10-CM | POA: Diagnosis not present

## 2016-10-13 DIAGNOSIS — M8589 Other specified disorders of bone density and structure, multiple sites: Secondary | ICD-10-CM | POA: Diagnosis not present

## 2016-10-15 DIAGNOSIS — R69 Illness, unspecified: Secondary | ICD-10-CM | POA: Diagnosis not present

## 2016-11-04 DIAGNOSIS — H25811 Combined forms of age-related cataract, right eye: Secondary | ICD-10-CM | POA: Diagnosis not present

## 2016-11-04 DIAGNOSIS — H40013 Open angle with borderline findings, low risk, bilateral: Secondary | ICD-10-CM | POA: Diagnosis not present

## 2016-11-12 DIAGNOSIS — H2511 Age-related nuclear cataract, right eye: Secondary | ICD-10-CM | POA: Diagnosis not present

## 2016-11-12 DIAGNOSIS — H25811 Combined forms of age-related cataract, right eye: Secondary | ICD-10-CM | POA: Diagnosis not present

## 2016-11-19 DIAGNOSIS — H2512 Age-related nuclear cataract, left eye: Secondary | ICD-10-CM | POA: Diagnosis not present

## 2016-11-19 DIAGNOSIS — H25812 Combined forms of age-related cataract, left eye: Secondary | ICD-10-CM | POA: Diagnosis not present

## 2017-03-03 IMAGING — CR DG CHEST 2V
2 series · 2 of 2 positions shown · non-contrast
Comparison: None in PACs

CLINICAL DATA: Preoperative exam prior to knee related surgery, no
known cardiopulmonary history

EXAM:
CHEST  2 VIEW

[w chest pa]
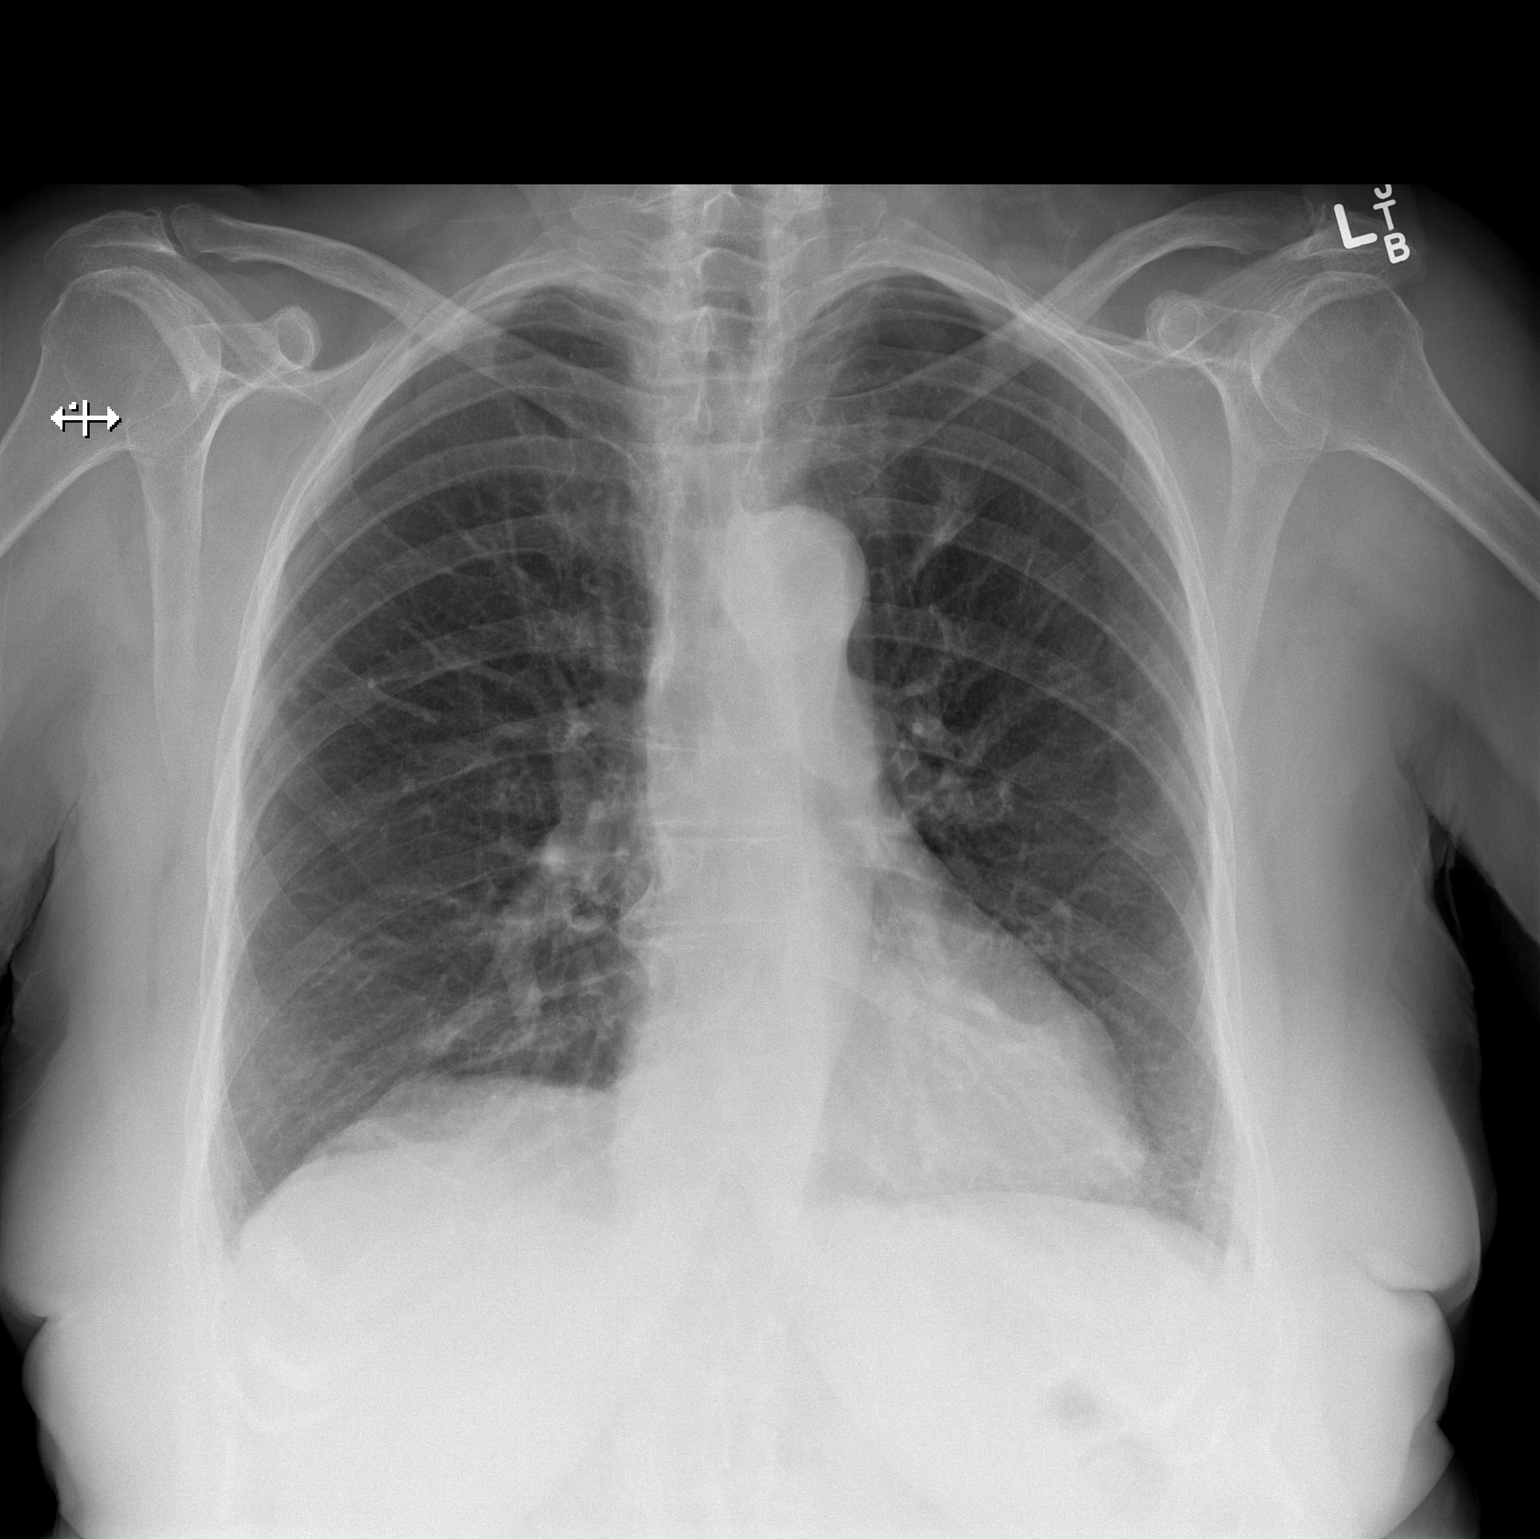

[w chest lat]
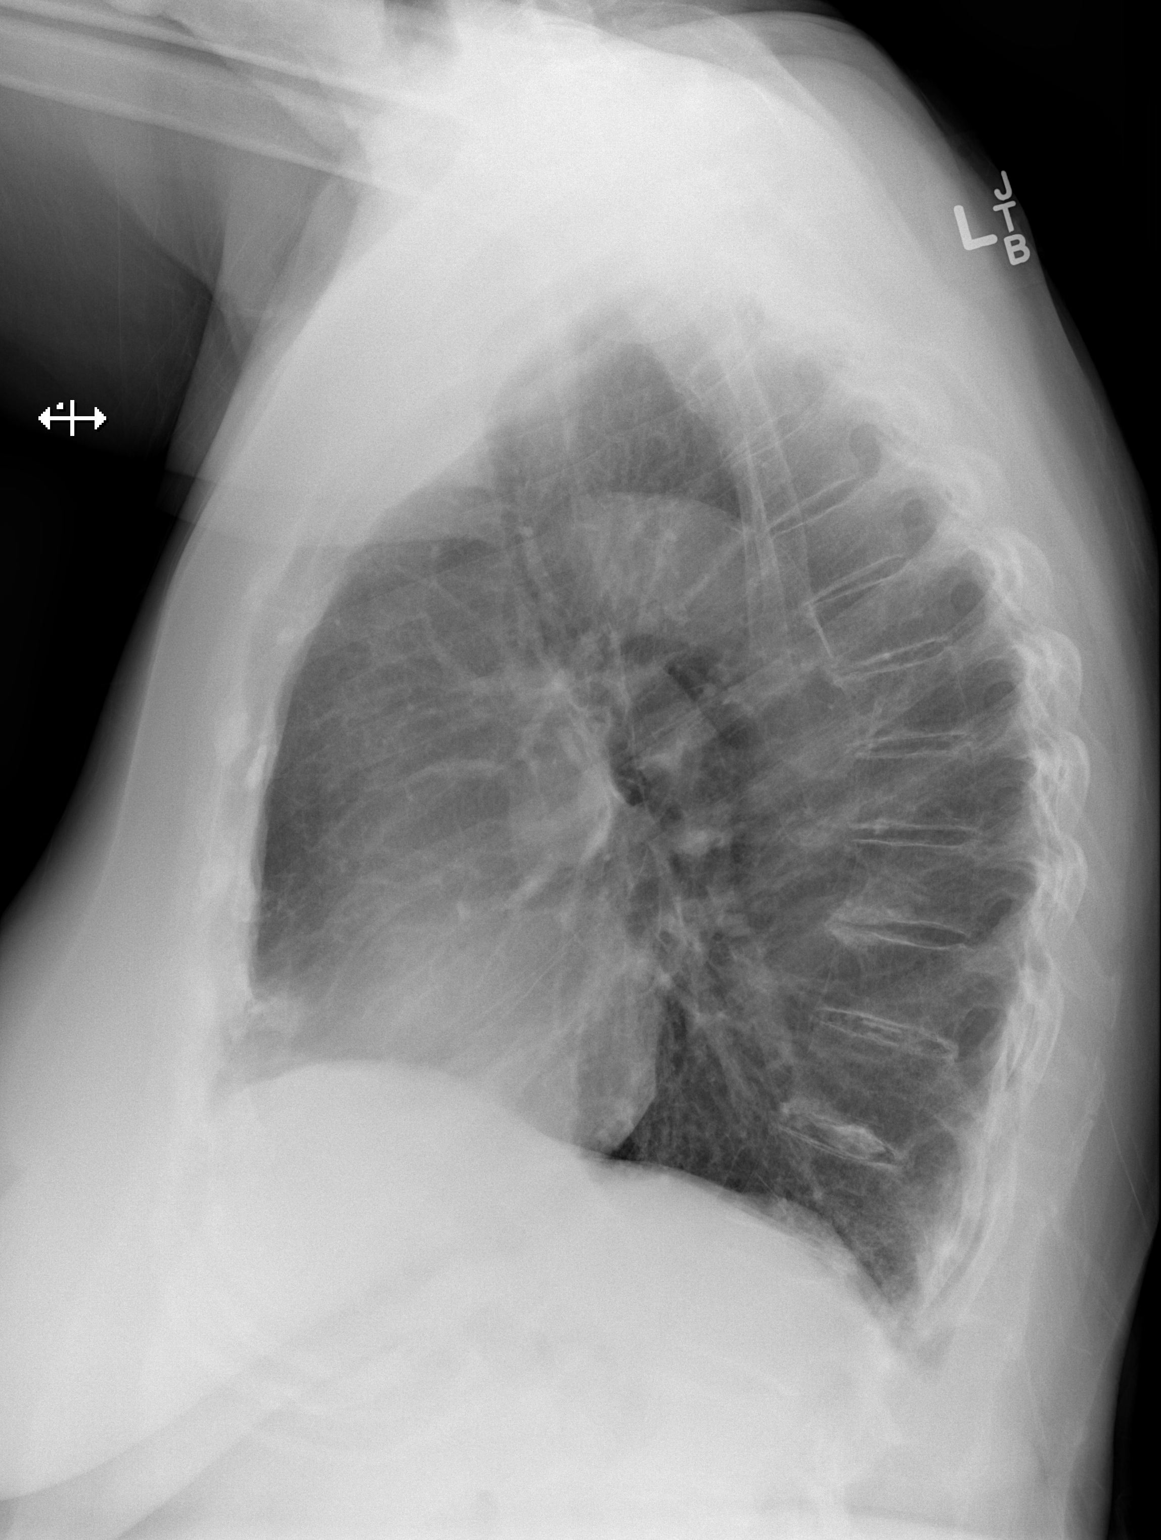

[2 of 2 positions shown; findings below may reference images not displayed]

FINDINGS: There is subtle increased density projecting inferior to the
anterior aspect of the left first rib but not clearly involving the
rib. On the left just above the hemidiaphragm anteriorly there is
subtle increased parenchymal density likely in the lingula. There is
no pleural effusion. The heart and pulmonary vascularity are normal.
The mediastinum is normal in width. The trachea is midline. There is
mild degenerative disc disease of the thoracic spine.
IMPRESSION: Parenchymal density in the left upper lobe and likely in the right
middle lobe. The findings are nonspecific. Given the lack of any
previous studies, chest CT scanning is recommended.

## 2017-03-27 DIAGNOSIS — G309 Alzheimer's disease, unspecified: Secondary | ICD-10-CM | POA: Diagnosis not present

## 2017-03-27 DIAGNOSIS — E559 Vitamin D deficiency, unspecified: Secondary | ICD-10-CM | POA: Diagnosis not present

## 2017-03-27 DIAGNOSIS — Z9181 History of falling: Secondary | ICD-10-CM | POA: Diagnosis not present

## 2017-03-27 DIAGNOSIS — R918 Other nonspecific abnormal finding of lung field: Secondary | ICD-10-CM | POA: Diagnosis not present

## 2017-03-27 DIAGNOSIS — G4733 Obstructive sleep apnea (adult) (pediatric): Secondary | ICD-10-CM | POA: Diagnosis not present

## 2017-03-27 DIAGNOSIS — R69 Illness, unspecified: Secondary | ICD-10-CM | POA: Diagnosis not present

## 2017-03-27 DIAGNOSIS — Z6835 Body mass index (BMI) 35.0-35.9, adult: Secondary | ICD-10-CM | POA: Diagnosis not present

## 2017-03-27 DIAGNOSIS — R7301 Impaired fasting glucose: Secondary | ICD-10-CM | POA: Diagnosis not present

## 2017-03-27 DIAGNOSIS — E785 Hyperlipidemia, unspecified: Secondary | ICD-10-CM | POA: Diagnosis not present

## 2017-03-27 DIAGNOSIS — Z1389 Encounter for screening for other disorder: Secondary | ICD-10-CM | POA: Diagnosis not present

## 2017-04-14 DIAGNOSIS — R0902 Hypoxemia: Secondary | ICD-10-CM | POA: Diagnosis not present

## 2017-04-14 DIAGNOSIS — G4733 Obstructive sleep apnea (adult) (pediatric): Secondary | ICD-10-CM | POA: Diagnosis not present

## 2017-04-14 DIAGNOSIS — J301 Allergic rhinitis due to pollen: Secondary | ICD-10-CM | POA: Diagnosis not present

## 2017-04-14 DIAGNOSIS — R5383 Other fatigue: Secondary | ICD-10-CM | POA: Diagnosis not present

## 2017-04-22 DIAGNOSIS — J8410 Pulmonary fibrosis, unspecified: Secondary | ICD-10-CM | POA: Diagnosis not present

## 2017-04-22 DIAGNOSIS — R918 Other nonspecific abnormal finding of lung field: Secondary | ICD-10-CM | POA: Diagnosis not present

## 2017-08-08 DIAGNOSIS — J209 Acute bronchitis, unspecified: Secondary | ICD-10-CM | POA: Diagnosis not present

## 2017-08-08 DIAGNOSIS — J301 Allergic rhinitis due to pollen: Secondary | ICD-10-CM | POA: Diagnosis not present

## 2017-08-08 DIAGNOSIS — J0100 Acute maxillary sinusitis, unspecified: Secondary | ICD-10-CM | POA: Diagnosis not present

## 2017-09-15 DIAGNOSIS — G4733 Obstructive sleep apnea (adult) (pediatric): Secondary | ICD-10-CM | POA: Diagnosis not present

## 2017-09-30 ENCOUNTER — Institutional Professional Consult (permissible substitution): Payer: Medicare HMO | Admitting: Internal Medicine

## 2017-10-07 DIAGNOSIS — R0902 Hypoxemia: Secondary | ICD-10-CM | POA: Diagnosis not present

## 2017-10-07 DIAGNOSIS — J301 Allergic rhinitis due to pollen: Secondary | ICD-10-CM | POA: Diagnosis not present

## 2017-10-07 DIAGNOSIS — G4733 Obstructive sleep apnea (adult) (pediatric): Secondary | ICD-10-CM | POA: Diagnosis not present

## 2017-10-07 DIAGNOSIS — R5383 Other fatigue: Secondary | ICD-10-CM | POA: Diagnosis not present

## 2017-10-09 DIAGNOSIS — E559 Vitamin D deficiency, unspecified: Secondary | ICD-10-CM | POA: Diagnosis not present

## 2017-10-09 DIAGNOSIS — Z23 Encounter for immunization: Secondary | ICD-10-CM | POA: Diagnosis not present

## 2017-10-09 DIAGNOSIS — E785 Hyperlipidemia, unspecified: Secondary | ICD-10-CM | POA: Diagnosis not present

## 2017-10-09 DIAGNOSIS — R7301 Impaired fasting glucose: Secondary | ICD-10-CM | POA: Diagnosis not present

## 2017-10-09 DIAGNOSIS — R69 Illness, unspecified: Secondary | ICD-10-CM | POA: Diagnosis not present

## 2017-10-09 DIAGNOSIS — G309 Alzheimer's disease, unspecified: Secondary | ICD-10-CM | POA: Diagnosis not present

## 2017-10-09 DIAGNOSIS — Z6834 Body mass index (BMI) 34.0-34.9, adult: Secondary | ICD-10-CM | POA: Diagnosis not present

## 2017-10-15 DIAGNOSIS — G309 Alzheimer's disease, unspecified: Secondary | ICD-10-CM | POA: Diagnosis not present

## 2017-10-15 DIAGNOSIS — R7301 Impaired fasting glucose: Secondary | ICD-10-CM | POA: Diagnosis not present

## 2017-10-15 DIAGNOSIS — R69 Illness, unspecified: Secondary | ICD-10-CM | POA: Diagnosis not present

## 2017-10-15 DIAGNOSIS — Z6834 Body mass index (BMI) 34.0-34.9, adult: Secondary | ICD-10-CM | POA: Diagnosis not present

## 2017-10-15 DIAGNOSIS — E785 Hyperlipidemia, unspecified: Secondary | ICD-10-CM | POA: Diagnosis not present

## 2017-10-15 DIAGNOSIS — H6123 Impacted cerumen, bilateral: Secondary | ICD-10-CM | POA: Diagnosis not present

## 2017-10-15 DIAGNOSIS — E559 Vitamin D deficiency, unspecified: Secondary | ICD-10-CM | POA: Diagnosis not present

## 2017-10-16 DIAGNOSIS — R0902 Hypoxemia: Secondary | ICD-10-CM | POA: Diagnosis not present

## 2017-10-16 DIAGNOSIS — R0600 Dyspnea, unspecified: Secondary | ICD-10-CM | POA: Diagnosis not present

## 2017-11-03 DIAGNOSIS — G4733 Obstructive sleep apnea (adult) (pediatric): Secondary | ICD-10-CM | POA: Diagnosis not present

## 2017-12-03 DIAGNOSIS — G4733 Obstructive sleep apnea (adult) (pediatric): Secondary | ICD-10-CM | POA: Diagnosis not present

## 2017-12-14 DIAGNOSIS — G4733 Obstructive sleep apnea (adult) (pediatric): Secondary | ICD-10-CM | POA: Diagnosis not present

## 2017-12-16 DIAGNOSIS — R5383 Other fatigue: Secondary | ICD-10-CM | POA: Diagnosis not present

## 2017-12-16 DIAGNOSIS — R0902 Hypoxemia: Secondary | ICD-10-CM | POA: Diagnosis not present

## 2017-12-16 DIAGNOSIS — J301 Allergic rhinitis due to pollen: Secondary | ICD-10-CM | POA: Diagnosis not present

## 2017-12-16 DIAGNOSIS — G4733 Obstructive sleep apnea (adult) (pediatric): Secondary | ICD-10-CM | POA: Diagnosis not present

## 2017-12-21 DIAGNOSIS — Z0100 Encounter for examination of eyes and vision without abnormal findings: Secondary | ICD-10-CM | POA: Diagnosis not present

## 2017-12-21 DIAGNOSIS — H524 Presbyopia: Secondary | ICD-10-CM | POA: Diagnosis not present

## 2018-01-03 DIAGNOSIS — G4733 Obstructive sleep apnea (adult) (pediatric): Secondary | ICD-10-CM | POA: Diagnosis not present

## 2018-02-03 DIAGNOSIS — Z136 Encounter for screening for cardiovascular disorders: Secondary | ICD-10-CM | POA: Diagnosis not present

## 2018-02-03 DIAGNOSIS — N959 Unspecified menopausal and perimenopausal disorder: Secondary | ICD-10-CM | POA: Diagnosis not present

## 2018-02-03 DIAGNOSIS — G4733 Obstructive sleep apnea (adult) (pediatric): Secondary | ICD-10-CM | POA: Diagnosis not present

## 2018-02-03 DIAGNOSIS — Z9181 History of falling: Secondary | ICD-10-CM | POA: Diagnosis not present

## 2018-02-03 DIAGNOSIS — Z1231 Encounter for screening mammogram for malignant neoplasm of breast: Secondary | ICD-10-CM | POA: Diagnosis not present

## 2018-02-03 DIAGNOSIS — Z6835 Body mass index (BMI) 35.0-35.9, adult: Secondary | ICD-10-CM | POA: Diagnosis not present

## 2018-02-03 DIAGNOSIS — Z0000 Encounter for general adult medical examination without abnormal findings: Secondary | ICD-10-CM | POA: Diagnosis not present

## 2018-02-03 DIAGNOSIS — E785 Hyperlipidemia, unspecified: Secondary | ICD-10-CM | POA: Diagnosis not present

## 2018-02-03 DIAGNOSIS — Z1331 Encounter for screening for depression: Secondary | ICD-10-CM | POA: Diagnosis not present

## 2018-02-03 DIAGNOSIS — E669 Obesity, unspecified: Secondary | ICD-10-CM | POA: Diagnosis not present

## 2018-02-05 DIAGNOSIS — G4733 Obstructive sleep apnea (adult) (pediatric): Secondary | ICD-10-CM | POA: Diagnosis not present

## 2018-03-03 DIAGNOSIS — G4733 Obstructive sleep apnea (adult) (pediatric): Secondary | ICD-10-CM | POA: Diagnosis not present

## 2018-03-15 DIAGNOSIS — R69 Illness, unspecified: Secondary | ICD-10-CM | POA: Diagnosis not present

## 2018-03-19 DIAGNOSIS — R5383 Other fatigue: Secondary | ICD-10-CM | POA: Diagnosis not present

## 2018-03-19 DIAGNOSIS — J301 Allergic rhinitis due to pollen: Secondary | ICD-10-CM | POA: Diagnosis not present

## 2018-03-19 DIAGNOSIS — E559 Vitamin D deficiency, unspecified: Secondary | ICD-10-CM | POA: Diagnosis not present

## 2018-03-19 DIAGNOSIS — G4733 Obstructive sleep apnea (adult) (pediatric): Secondary | ICD-10-CM | POA: Diagnosis not present

## 2018-04-03 DIAGNOSIS — G4733 Obstructive sleep apnea (adult) (pediatric): Secondary | ICD-10-CM | POA: Diagnosis not present

## 2018-04-15 DIAGNOSIS — G309 Alzheimer's disease, unspecified: Secondary | ICD-10-CM | POA: Diagnosis not present

## 2018-04-15 DIAGNOSIS — R7301 Impaired fasting glucose: Secondary | ICD-10-CM | POA: Diagnosis not present

## 2018-04-15 DIAGNOSIS — E785 Hyperlipidemia, unspecified: Secondary | ICD-10-CM | POA: Diagnosis not present

## 2018-04-15 DIAGNOSIS — E559 Vitamin D deficiency, unspecified: Secondary | ICD-10-CM | POA: Diagnosis not present

## 2018-04-15 DIAGNOSIS — R69 Illness, unspecified: Secondary | ICD-10-CM | POA: Diagnosis not present

## 2018-04-15 DIAGNOSIS — Z6836 Body mass index (BMI) 36.0-36.9, adult: Secondary | ICD-10-CM | POA: Diagnosis not present

## 2018-04-15 DIAGNOSIS — M8589 Other specified disorders of bone density and structure, multiple sites: Secondary | ICD-10-CM | POA: Diagnosis not present

## 2018-05-03 DIAGNOSIS — G4733 Obstructive sleep apnea (adult) (pediatric): Secondary | ICD-10-CM | POA: Diagnosis not present

## 2018-05-13 DIAGNOSIS — G4733 Obstructive sleep apnea (adult) (pediatric): Secondary | ICD-10-CM | POA: Diagnosis not present

## 2018-06-01 DIAGNOSIS — R600 Localized edema: Secondary | ICD-10-CM | POA: Diagnosis not present

## 2018-06-03 DIAGNOSIS — G4733 Obstructive sleep apnea (adult) (pediatric): Secondary | ICD-10-CM | POA: Diagnosis not present

## 2018-06-25 DIAGNOSIS — K625 Hemorrhage of anus and rectum: Secondary | ICD-10-CM | POA: Diagnosis not present

## 2018-06-25 DIAGNOSIS — Z6836 Body mass index (BMI) 36.0-36.9, adult: Secondary | ICD-10-CM | POA: Diagnosis not present

## 2018-07-03 DIAGNOSIS — G4733 Obstructive sleep apnea (adult) (pediatric): Secondary | ICD-10-CM | POA: Diagnosis not present

## 2018-07-05 DIAGNOSIS — G4733 Obstructive sleep apnea (adult) (pediatric): Secondary | ICD-10-CM | POA: Diagnosis not present

## 2018-07-06 DIAGNOSIS — G4733 Obstructive sleep apnea (adult) (pediatric): Secondary | ICD-10-CM | POA: Diagnosis not present

## 2018-07-06 DIAGNOSIS — J301 Allergic rhinitis due to pollen: Secondary | ICD-10-CM | POA: Diagnosis not present

## 2018-07-06 DIAGNOSIS — R5383 Other fatigue: Secondary | ICD-10-CM | POA: Diagnosis not present

## 2018-08-03 DIAGNOSIS — G4733 Obstructive sleep apnea (adult) (pediatric): Secondary | ICD-10-CM | POA: Diagnosis not present

## 2018-08-09 DIAGNOSIS — K648 Other hemorrhoids: Secondary | ICD-10-CM | POA: Diagnosis not present

## 2018-08-09 DIAGNOSIS — K219 Gastro-esophageal reflux disease without esophagitis: Secondary | ICD-10-CM | POA: Diagnosis not present

## 2018-08-09 DIAGNOSIS — K921 Melena: Secondary | ICD-10-CM | POA: Diagnosis not present

## 2018-09-03 DIAGNOSIS — G4733 Obstructive sleep apnea (adult) (pediatric): Secondary | ICD-10-CM | POA: Diagnosis not present

## 2018-09-10 DIAGNOSIS — G4733 Obstructive sleep apnea (adult) (pediatric): Secondary | ICD-10-CM | POA: Diagnosis not present

## 2018-09-24 DIAGNOSIS — G4733 Obstructive sleep apnea (adult) (pediatric): Secondary | ICD-10-CM | POA: Diagnosis not present

## 2018-09-24 DIAGNOSIS — R5383 Other fatigue: Secondary | ICD-10-CM | POA: Diagnosis not present

## 2018-09-24 DIAGNOSIS — J301 Allergic rhinitis due to pollen: Secondary | ICD-10-CM | POA: Diagnosis not present

## 2018-10-06 DIAGNOSIS — R350 Frequency of micturition: Secondary | ICD-10-CM | POA: Diagnosis not present

## 2018-10-06 DIAGNOSIS — R32 Unspecified urinary incontinence: Secondary | ICD-10-CM | POA: Diagnosis not present

## 2018-10-18 DIAGNOSIS — R69 Illness, unspecified: Secondary | ICD-10-CM | POA: Diagnosis not present

## 2018-10-18 DIAGNOSIS — M8589 Other specified disorders of bone density and structure, multiple sites: Secondary | ICD-10-CM | POA: Diagnosis not present

## 2018-10-18 DIAGNOSIS — G309 Alzheimer's disease, unspecified: Secondary | ICD-10-CM | POA: Diagnosis not present

## 2018-10-18 DIAGNOSIS — E559 Vitamin D deficiency, unspecified: Secondary | ICD-10-CM | POA: Diagnosis not present

## 2018-10-18 DIAGNOSIS — R7301 Impaired fasting glucose: Secondary | ICD-10-CM | POA: Diagnosis not present

## 2018-10-18 DIAGNOSIS — E785 Hyperlipidemia, unspecified: Secondary | ICD-10-CM | POA: Diagnosis not present

## 2018-10-18 DIAGNOSIS — Z23 Encounter for immunization: Secondary | ICD-10-CM | POA: Diagnosis not present

## 2018-12-12 DIAGNOSIS — G4733 Obstructive sleep apnea (adult) (pediatric): Secondary | ICD-10-CM | POA: Diagnosis not present

## 2018-12-22 DIAGNOSIS — M85851 Other specified disorders of bone density and structure, right thigh: Secondary | ICD-10-CM | POA: Diagnosis not present

## 2018-12-22 DIAGNOSIS — M8589 Other specified disorders of bone density and structure, multiple sites: Secondary | ICD-10-CM | POA: Diagnosis not present

## 2019-01-12 DIAGNOSIS — G4733 Obstructive sleep apnea (adult) (pediatric): Secondary | ICD-10-CM | POA: Diagnosis not present

## 2019-02-11 DIAGNOSIS — G4733 Obstructive sleep apnea (adult) (pediatric): Secondary | ICD-10-CM | POA: Diagnosis not present

## 2019-02-12 DIAGNOSIS — G4733 Obstructive sleep apnea (adult) (pediatric): Secondary | ICD-10-CM | POA: Diagnosis not present

## 2019-03-15 DIAGNOSIS — G4733 Obstructive sleep apnea (adult) (pediatric): Secondary | ICD-10-CM | POA: Diagnosis not present

## 2019-03-30 DIAGNOSIS — J301 Allergic rhinitis due to pollen: Secondary | ICD-10-CM | POA: Diagnosis not present

## 2019-03-30 DIAGNOSIS — R5383 Other fatigue: Secondary | ICD-10-CM | POA: Diagnosis not present

## 2019-03-30 DIAGNOSIS — G4733 Obstructive sleep apnea (adult) (pediatric): Secondary | ICD-10-CM | POA: Diagnosis not present

## 2019-04-27 DIAGNOSIS — Z1331 Encounter for screening for depression: Secondary | ICD-10-CM | POA: Diagnosis not present

## 2019-04-27 DIAGNOSIS — Z1231 Encounter for screening mammogram for malignant neoplasm of breast: Secondary | ICD-10-CM | POA: Diagnosis not present

## 2019-04-27 DIAGNOSIS — Z1339 Encounter for screening examination for other mental health and behavioral disorders: Secondary | ICD-10-CM | POA: Diagnosis not present

## 2019-04-27 DIAGNOSIS — Z6838 Body mass index (BMI) 38.0-38.9, adult: Secondary | ICD-10-CM | POA: Diagnosis not present

## 2019-04-27 DIAGNOSIS — Z9181 History of falling: Secondary | ICD-10-CM | POA: Diagnosis not present

## 2019-04-27 DIAGNOSIS — E785 Hyperlipidemia, unspecified: Secondary | ICD-10-CM | POA: Diagnosis not present

## 2019-04-27 DIAGNOSIS — Z0000 Encounter for general adult medical examination without abnormal findings: Secondary | ICD-10-CM | POA: Diagnosis not present

## 2019-05-11 DIAGNOSIS — G4733 Obstructive sleep apnea (adult) (pediatric): Secondary | ICD-10-CM | POA: Diagnosis not present

## 2019-06-03 DIAGNOSIS — Z961 Presence of intraocular lens: Secondary | ICD-10-CM | POA: Diagnosis not present

## 2019-06-03 DIAGNOSIS — H524 Presbyopia: Secondary | ICD-10-CM | POA: Diagnosis not present

## 2019-06-13 DIAGNOSIS — G4733 Obstructive sleep apnea (adult) (pediatric): Secondary | ICD-10-CM | POA: Diagnosis not present

## 2019-06-14 DIAGNOSIS — Z139 Encounter for screening, unspecified: Secondary | ICD-10-CM | POA: Diagnosis not present

## 2019-06-14 DIAGNOSIS — R69 Illness, unspecified: Secondary | ICD-10-CM | POA: Diagnosis not present

## 2019-06-14 DIAGNOSIS — G309 Alzheimer's disease, unspecified: Secondary | ICD-10-CM | POA: Diagnosis not present

## 2019-06-14 DIAGNOSIS — I10 Essential (primary) hypertension: Secondary | ICD-10-CM | POA: Diagnosis not present

## 2019-06-14 DIAGNOSIS — M8589 Other specified disorders of bone density and structure, multiple sites: Secondary | ICD-10-CM | POA: Diagnosis not present

## 2019-06-14 DIAGNOSIS — E785 Hyperlipidemia, unspecified: Secondary | ICD-10-CM | POA: Diagnosis not present

## 2019-06-14 DIAGNOSIS — E559 Vitamin D deficiency, unspecified: Secondary | ICD-10-CM | POA: Diagnosis not present

## 2019-06-14 DIAGNOSIS — R7301 Impaired fasting glucose: Secondary | ICD-10-CM | POA: Diagnosis not present

## 2019-07-12 DIAGNOSIS — I10 Essential (primary) hypertension: Secondary | ICD-10-CM | POA: Diagnosis not present

## 2019-07-21 DIAGNOSIS — G4733 Obstructive sleep apnea (adult) (pediatric): Secondary | ICD-10-CM | POA: Diagnosis not present

## 2019-08-08 DIAGNOSIS — I10 Essential (primary) hypertension: Secondary | ICD-10-CM | POA: Diagnosis not present

## 2019-08-08 DIAGNOSIS — R600 Localized edema: Secondary | ICD-10-CM | POA: Diagnosis not present

## 2019-10-13 DIAGNOSIS — M79675 Pain in left toe(s): Secondary | ICD-10-CM | POA: Diagnosis not present

## 2019-10-13 DIAGNOSIS — B351 Tinea unguium: Secondary | ICD-10-CM | POA: Diagnosis not present

## 2019-10-13 DIAGNOSIS — M79674 Pain in right toe(s): Secondary | ICD-10-CM | POA: Diagnosis not present

## 2019-10-18 DIAGNOSIS — G4733 Obstructive sleep apnea (adult) (pediatric): Secondary | ICD-10-CM | POA: Diagnosis not present

## 2019-11-11 DIAGNOSIS — Z23 Encounter for immunization: Secondary | ICD-10-CM | POA: Diagnosis not present

## 2019-11-11 DIAGNOSIS — E785 Hyperlipidemia, unspecified: Secondary | ICD-10-CM | POA: Diagnosis not present

## 2019-11-11 DIAGNOSIS — R69 Illness, unspecified: Secondary | ICD-10-CM | POA: Diagnosis not present

## 2019-11-11 DIAGNOSIS — I10 Essential (primary) hypertension: Secondary | ICD-10-CM | POA: Diagnosis not present

## 2019-11-11 DIAGNOSIS — R7301 Impaired fasting glucose: Secondary | ICD-10-CM | POA: Diagnosis not present

## 2019-11-11 DIAGNOSIS — M8589 Other specified disorders of bone density and structure, multiple sites: Secondary | ICD-10-CM | POA: Diagnosis not present

## 2019-11-11 DIAGNOSIS — G309 Alzheimer's disease, unspecified: Secondary | ICD-10-CM | POA: Diagnosis not present

## 2019-11-11 DIAGNOSIS — E559 Vitamin D deficiency, unspecified: Secondary | ICD-10-CM | POA: Diagnosis not present

## 2020-02-13 DIAGNOSIS — I2699 Other pulmonary embolism without acute cor pulmonale: Secondary | ICD-10-CM

## 2020-02-13 HISTORY — DX: Other pulmonary embolism without acute cor pulmonale: I26.99

## 2020-02-21 DIAGNOSIS — R32 Unspecified urinary incontinence: Secondary | ICD-10-CM | POA: Diagnosis not present

## 2020-02-28 DIAGNOSIS — N952 Postmenopausal atrophic vaginitis: Secondary | ICD-10-CM | POA: Diagnosis not present

## 2020-02-28 DIAGNOSIS — R69 Illness, unspecified: Secondary | ICD-10-CM | POA: Diagnosis not present

## 2020-02-28 DIAGNOSIS — G309 Alzheimer's disease, unspecified: Secondary | ICD-10-CM | POA: Diagnosis not present

## 2020-02-28 DIAGNOSIS — R32 Unspecified urinary incontinence: Secondary | ICD-10-CM | POA: Diagnosis not present

## 2020-02-28 DIAGNOSIS — Z79899 Other long term (current) drug therapy: Secondary | ICD-10-CM | POA: Diagnosis not present

## 2020-03-07 DIAGNOSIS — G309 Alzheimer's disease, unspecified: Secondary | ICD-10-CM | POA: Diagnosis not present

## 2020-03-07 DIAGNOSIS — R262 Difficulty in walking, not elsewhere classified: Secondary | ICD-10-CM | POA: Diagnosis not present

## 2020-03-07 DIAGNOSIS — R69 Illness, unspecified: Secondary | ICD-10-CM | POA: Diagnosis not present

## 2020-03-07 DIAGNOSIS — I10 Essential (primary) hypertension: Secondary | ICD-10-CM | POA: Diagnosis not present

## 2020-03-09 ENCOUNTER — Inpatient Hospital Stay (HOSPITAL_COMMUNITY): Payer: Medicare HMO

## 2020-03-09 ENCOUNTER — Inpatient Hospital Stay (HOSPITAL_COMMUNITY)
Admission: AD | Admit: 2020-03-09 | Discharge: 2020-03-14 | DRG: 175 | Disposition: A | Discharge status: Home Health | Payer: Medicare HMO | Source: Other Acute Inpatient Hospital | Attending: Internal Medicine | Admitting: Internal Medicine

## 2020-03-09 ENCOUNTER — Encounter (HOSPITAL_COMMUNITY): Payer: Self-pay | Admitting: Pulmonary Disease

## 2020-03-09 DIAGNOSIS — I82412 Acute embolism and thrombosis of left femoral vein: Secondary | ICD-10-CM | POA: Diagnosis present

## 2020-03-09 DIAGNOSIS — R079 Chest pain, unspecified: Secondary | ICD-10-CM | POA: Diagnosis not present

## 2020-03-09 DIAGNOSIS — N179 Acute kidney failure, unspecified: Secondary | ICD-10-CM | POA: Diagnosis present

## 2020-03-09 DIAGNOSIS — I959 Hypotension, unspecified: Secondary | ICD-10-CM | POA: Diagnosis not present

## 2020-03-09 DIAGNOSIS — Z781 Physical restraint status: Secondary | ICD-10-CM

## 2020-03-09 DIAGNOSIS — G4733 Obstructive sleep apnea (adult) (pediatric): Secondary | ICD-10-CM | POA: Diagnosis present

## 2020-03-09 DIAGNOSIS — R0602 Shortness of breath: Secondary | ICD-10-CM | POA: Diagnosis not present

## 2020-03-09 DIAGNOSIS — Z66 Do not resuscitate: Secondary | ICD-10-CM | POA: Diagnosis present

## 2020-03-09 DIAGNOSIS — I2699 Other pulmonary embolism without acute cor pulmonale: Secondary | ICD-10-CM | POA: Diagnosis present

## 2020-03-09 DIAGNOSIS — Z96651 Presence of right artificial knee joint: Secondary | ICD-10-CM | POA: Diagnosis present

## 2020-03-09 DIAGNOSIS — R739 Hyperglycemia, unspecified: Secondary | ICD-10-CM | POA: Diagnosis present

## 2020-03-09 DIAGNOSIS — Z79899 Other long term (current) drug therapy: Secondary | ICD-10-CM

## 2020-03-09 DIAGNOSIS — Z6838 Body mass index (BMI) 38.0-38.9, adult: Secondary | ICD-10-CM

## 2020-03-09 DIAGNOSIS — N189 Chronic kidney disease, unspecified: Secondary | ICD-10-CM | POA: Diagnosis not present

## 2020-03-09 DIAGNOSIS — R0789 Other chest pain: Secondary | ICD-10-CM | POA: Diagnosis not present

## 2020-03-09 DIAGNOSIS — I82402 Acute embolism and thrombosis of unspecified deep veins of left lower extremity: Secondary | ICD-10-CM | POA: Diagnosis not present

## 2020-03-09 DIAGNOSIS — R05 Cough: Secondary | ICD-10-CM | POA: Diagnosis not present

## 2020-03-09 DIAGNOSIS — I5023 Acute on chronic systolic (congestive) heart failure: Secondary | ICD-10-CM | POA: Diagnosis not present

## 2020-03-09 DIAGNOSIS — R262 Difficulty in walking, not elsewhere classified: Secondary | ICD-10-CM | POA: Diagnosis not present

## 2020-03-09 DIAGNOSIS — J9601 Acute respiratory failure with hypoxia: Secondary | ICD-10-CM | POA: Diagnosis not present

## 2020-03-09 DIAGNOSIS — Z7901 Long term (current) use of anticoagulants: Secondary | ICD-10-CM

## 2020-03-09 DIAGNOSIS — F0391 Unspecified dementia with behavioral disturbance: Secondary | ICD-10-CM | POA: Diagnosis present

## 2020-03-09 DIAGNOSIS — I82432 Acute embolism and thrombosis of left popliteal vein: Secondary | ICD-10-CM | POA: Diagnosis not present

## 2020-03-09 DIAGNOSIS — Z531 Procedure and treatment not carried out because of patient's decision for reasons of belief and group pressure: Secondary | ICD-10-CM | POA: Diagnosis present

## 2020-03-09 DIAGNOSIS — I130 Hypertensive heart and chronic kidney disease with heart failure and stage 1 through stage 4 chronic kidney disease, or unspecified chronic kidney disease: Secondary | ICD-10-CM | POA: Diagnosis not present

## 2020-03-09 DIAGNOSIS — F03918 Unspecified dementia, unspecified severity, with other behavioral disturbance: Secondary | ICD-10-CM | POA: Diagnosis present

## 2020-03-09 DIAGNOSIS — I5031 Acute diastolic (congestive) heart failure: Secondary | ICD-10-CM

## 2020-03-09 DIAGNOSIS — N183 Chronic kidney disease, stage 3 unspecified: Secondary | ICD-10-CM | POA: Diagnosis present

## 2020-03-09 DIAGNOSIS — I2692 Saddle embolus of pulmonary artery without acute cor pulmonale: Principal | ICD-10-CM | POA: Diagnosis present

## 2020-03-09 DIAGNOSIS — G8929 Other chronic pain: Secondary | ICD-10-CM | POA: Diagnosis present

## 2020-03-09 DIAGNOSIS — I2602 Saddle embolus of pulmonary artery with acute cor pulmonale: Secondary | ICD-10-CM

## 2020-03-09 DIAGNOSIS — I5032 Chronic diastolic (congestive) heart failure: Secondary | ICD-10-CM | POA: Diagnosis not present

## 2020-03-09 DIAGNOSIS — I5033 Acute on chronic diastolic (congestive) heart failure: Secondary | ICD-10-CM | POA: Diagnosis not present

## 2020-03-09 DIAGNOSIS — I10 Essential (primary) hypertension: Secondary | ICD-10-CM | POA: Diagnosis not present

## 2020-03-09 DIAGNOSIS — Z9981 Dependence on supplemental oxygen: Secondary | ICD-10-CM

## 2020-03-09 DIAGNOSIS — Z9089 Acquired absence of other organs: Secondary | ICD-10-CM | POA: Diagnosis not present

## 2020-03-09 DIAGNOSIS — Z9181 History of falling: Secondary | ICD-10-CM

## 2020-03-09 DIAGNOSIS — I82442 Acute embolism and thrombosis of left tibial vein: Secondary | ICD-10-CM | POA: Diagnosis present

## 2020-03-09 DIAGNOSIS — I82452 Acute embolism and thrombosis of left peroneal vein: Secondary | ICD-10-CM | POA: Diagnosis present

## 2020-03-09 DIAGNOSIS — I2609 Other pulmonary embolism with acute cor pulmonale: Secondary | ICD-10-CM | POA: Diagnosis present

## 2020-03-09 DIAGNOSIS — R5383 Other fatigue: Secondary | ICD-10-CM | POA: Diagnosis not present

## 2020-03-09 DIAGNOSIS — R531 Weakness: Secondary | ICD-10-CM | POA: Diagnosis not present

## 2020-03-09 DIAGNOSIS — R059 Cough, unspecified: Secondary | ICD-10-CM

## 2020-03-09 DIAGNOSIS — I214 Non-ST elevation (NSTEMI) myocardial infarction: Secondary | ICD-10-CM | POA: Diagnosis not present

## 2020-03-09 DIAGNOSIS — R69 Illness, unspecified: Secondary | ICD-10-CM | POA: Diagnosis not present

## 2020-03-09 DIAGNOSIS — F05 Delirium due to known physiological condition: Secondary | ICD-10-CM | POA: Diagnosis not present

## 2020-03-09 DIAGNOSIS — R0902 Hypoxemia: Secondary | ICD-10-CM | POA: Diagnosis not present

## 2020-03-09 DIAGNOSIS — Z79891 Long term (current) use of opiate analgesic: Secondary | ICD-10-CM

## 2020-03-09 HISTORY — DX: Reserved for inherently not codable concepts without codable children: IMO0001

## 2020-03-09 HISTORY — DX: Other specified health status: Z78.9

## 2020-03-09 LAB — GLUCOSE, CAPILLARY
Glucose-Capillary: 107 mg/dL — ABNORMAL HIGH (ref 70–99)
Glucose-Capillary: 107 mg/dL — ABNORMAL HIGH (ref 70–99)
Glucose-Capillary: 110 mg/dL — ABNORMAL HIGH (ref 70–99)

## 2020-03-09 LAB — NO BLOOD PRODUCTS

## 2020-03-09 LAB — MRSA PCR SCREENING: MRSA by PCR: NEGATIVE

## 2020-03-09 MED ORDER — LACTATED RINGERS IV SOLN: INTRAVENOUS | Status: DC

## 2020-03-09 MED ORDER — METHOCARBAMOL 500 MG PO TABS
500.0000 mg | ORAL_TABLET | Freq: Four times a day (QID) | ORAL | Status: DC | PRN
  Administered 2020-03-11: 1000 mg via ORAL
  Filled 2020-03-09: qty 2

## 2020-03-09 MED ORDER — HEPARIN (PORCINE) 25000 UT/250ML-% IV SOLN
1500.0000 [IU]/h | INTRAVENOUS | Status: AC
  Administered 2020-03-09 – 2020-03-13 (×5): 1500 [IU]/h via INTRAVENOUS
  Filled 2020-03-09 (×7): qty 250

## 2020-03-09 MED ORDER — HYDRALAZINE HCL 20 MG/ML IJ SOLN
10.0000 mg | INTRAMUSCULAR | Status: DC | PRN
  Administered 2020-03-09 – 2020-03-10 (×2): 10 mg via INTRAVENOUS
  Filled 2020-03-09 (×3): qty 1

## 2020-03-09 MED ORDER — RIVASTIGMINE 9.5 MG/24HR TD PT24
9.5000 mg | MEDICATED_PATCH | Freq: Every day | TRANSDERMAL | Status: DC
  Administered 2020-03-10 – 2020-03-14 (×5): 9.5 mg via TRANSDERMAL
  Filled 2020-03-09 (×5): qty 1

## 2020-03-09 MED ORDER — VITAMIN D (ERGOCALCIFEROL) 1.25 MG (50000 UNIT) PO CAPS: 50000.0000 [IU] | ORAL_CAPSULE | ORAL | Status: DC

## 2020-03-09 MED ORDER — GUAIFENESIN 100 MG/5ML PO SOLN
5.0000 mL | ORAL | Status: DC | PRN
  Administered 2020-03-11 (×3): 100 mg via ORAL
  Filled 2020-03-09 (×3): qty 5

## 2020-03-09 MED ORDER — BENZONATATE 100 MG PO CAPS
100.0000 mg | ORAL_CAPSULE | Freq: Three times a day (TID) | ORAL | Status: DC | PRN
  Administered 2020-03-09 – 2020-03-13 (×7): 100 mg via ORAL
  Filled 2020-03-09 (×7): qty 1

## 2020-03-09 MED ORDER — ACETAMINOPHEN 325 MG PO TABS
650.0000 mg | ORAL_TABLET | Freq: Four times a day (QID) | ORAL | Status: DC | PRN
  Administered 2020-03-09 – 2020-03-11 (×4): 650 mg via ORAL
  Filled 2020-03-09 (×4): qty 2

## 2020-03-09 MED ORDER — MEMANTINE HCL ER 28 MG PO CP24: 28.0000 mg | ORAL_CAPSULE | Freq: Every day | ORAL | Status: DC

## 2020-03-09 MED ORDER — HEPARIN BOLUS VIA INFUSION
5000.0000 [IU] | Freq: Once | INTRAVENOUS | Status: AC
  Administered 2020-03-09: 5000 [IU] via INTRAVENOUS
  Filled 2020-03-09: qty 5000

## 2020-03-09 MED ORDER — ORAL CARE MOUTH RINSE
15.0000 mL | Freq: Two times a day (BID) | OROMUCOSAL | Status: DC
  Administered 2020-03-10 – 2020-03-14 (×9): 15 mL via OROMUCOSAL

## 2020-03-09 MED ORDER — MIRABEGRON ER 25 MG PO TB24
50.0000 mg | ORAL_TABLET | Freq: Every day | ORAL | Status: DC
  Administered 2020-03-10 – 2020-03-14 (×5): 50 mg via ORAL
  Filled 2020-03-09: qty 1
  Filled 2020-03-09 (×4): qty 2

## 2020-03-09 MED ORDER — OXYCODONE HCL 5 MG PO TABS: 5.0000 mg | ORAL_TABLET | ORAL | Status: DC | PRN

## 2020-03-09 MED ORDER — RIVASTIGMINE TARTRATE 1.5 MG PO CAPS: 4.5000 mg | ORAL_CAPSULE | Freq: Two times a day (BID) | ORAL | Status: DC

## 2020-03-09 MED ORDER — VITAMIN D 25 MCG (1000 UNIT) PO TABS
1000.0000 [IU] | ORAL_TABLET | Freq: Every day | ORAL | Status: DC
  Administered 2020-03-09 – 2020-03-14 (×6): 1000 [IU] via ORAL
  Filled 2020-03-09 (×6): qty 1

## 2020-03-09 MED ORDER — METOPROLOL TARTRATE 5 MG/5ML IV SOLN
5.0000 mg | INTRAVENOUS | Status: DC | PRN
  Administered 2020-03-09 – 2020-03-11 (×5): 5 mg via INTRAVENOUS
  Filled 2020-03-09 (×8): qty 5

## 2020-03-09 MED ORDER — LISINOPRIL 20 MG PO TABS
20.0000 mg | ORAL_TABLET | Freq: Every day | ORAL | Status: DC
  Administered 2020-03-09 – 2020-03-14 (×6): 20 mg via ORAL
  Filled 2020-03-09 (×6): qty 1

## 2020-03-09 MED ORDER — ADULT MULTIVITAMIN W/MINERALS CH
1.0000 | ORAL_TABLET | Freq: Every day | ORAL | Status: DC
  Administered 2020-03-09 – 2020-03-14 (×6): 1 via ORAL
  Filled 2020-03-09 (×6): qty 1

## 2020-03-09 MED ORDER — VENLAFAXINE HCL ER 75 MG PO CP24
75.0000 mg | ORAL_CAPSULE | Freq: Every day | ORAL | Status: DC
  Administered 2020-03-10 – 2020-03-14 (×5): 75 mg via ORAL
  Filled 2020-03-09 (×6): qty 1

## 2020-03-09 MED ORDER — MEMANTINE HCL 10 MG PO TABS
10.0000 mg | ORAL_TABLET | Freq: Two times a day (BID) | ORAL | Status: DC
  Administered 2020-03-09 – 2020-03-14 (×10): 10 mg via ORAL
  Filled 2020-03-09 (×10): qty 1

## 2020-03-09 MED ORDER — DARIFENACIN HYDROBROMIDE ER 7.5 MG PO TB24
7.5000 mg | ORAL_TABLET | Freq: Every day | ORAL | Status: DC
  Administered 2020-03-10 – 2020-03-14 (×5): 7.5 mg via ORAL
  Filled 2020-03-09 (×6): qty 1

## 2020-03-09 NOTE — H&P (Signed)
NAME:  Brianna Pineda, MRN:  [id number], DOB:  05/30/1932, LOS: 0 ADMISSION DATE:  03/09/2020, CONSULTATION DATE:  03/09/2020 REFERRING MD:  Oval Linsey, CHIEF COMPLAINT:  Short of breath   Brief History   86 yo female with hx of dementia transferred from [facility] with acute PE with RV strain.  History of present illness   85 yo female seen by PCP for several days of dyspnea and fatigue.  She has baseline history of dementia and reportedly requires total care.  In ER at [facility] noted to have SpO2 88%.  ABG showed PaO2 62 on room air.  Creatinine 1.60, lactic acid 0.9, Troponin I 0.42, Hb 12.9, PLT 250.  CXR reports 2.3 x 2.2 cm opacity in Lt base.  CT angio chest showed b/l saddle PE with RV:LV ratio 1.23 and Lt basilar ATX with small effusion.  Past Medical History  HTN, Dementia, OSA, Diastolic CHF, Jehovah's witness  [facility] Events   3/26 Transfer from [facility]:    Procedures:    Significant Diagnostic Tests:  CT angio chest 3/26 Oval Linsey) >> b/l saddle PE with RV:LV ratio 1.23 and Lt basilar ATX with small effusion  Micro Data:  SARS CoV2 PCR 3/26 Oval Linsey) >> negative  Antimicrobials:    Interim history/subjective:    Objective   Blood pressure (!) 179/82, pulse 89, temperature 98 F (36.7 C), temperature source Axillary, resp. rate (!) 25, SpO2 97 %.       No intake or output data in the 24 hours ending 03/09/20 1624 There were no vitals filed for this visit.  Examination:  General - alert Eyes - pupils reactive ENT - no sinus tenderness, no stridor Cardiac - regular rate/rhythm, no murmur Chest - equal breath sounds b/l, no wheezing or rales Abdomen - soft, non tender, + bowel sounds Extremities - 2+ edema Skin - no rashes Neuro - normal strength, moves extremities, follows commands; able to state her name and why she is in hospital Lymphatics - no lymphadenopathy Psych - normal mood and behavior GU - no lesions noted   Resolved  Hospital Problem list     Assessment & Plan:   Acute pulmonary embolism with RV strain pattern. - heparin gtt per pharmacy - f/u Echo, doppler legs - oxygenation/hemodynamics stable on current therapy >> defer thrombolytic therapy at this time - might need to consider IVC filter at some point given potential bleeding complications related to fall risk  Cough. - prn tessalon, robitussin  Hx of OSA. - CPAP qhs  Hx of Dementia, chronic pain. - continue namenda, exelon, effexor  Hx of HTN. - continue lisinopril - prn hydralazine  Jehovah's witness. - no blood products  Goals of care. - updated pt's husband at bedside - she has progressive dementia and has become more sedentary with increasing care needs - she is DNR/DNI - discussed risk/benefit ratio of thrombolytic therapy >> he is in agreement to defer this unless clinical status gets worse   Best practice:  Diet: Regular diet DVT prophylaxis: Heparin gtt GI prophylaxis: Not indicated Mobility: Bed rest Code Status: DNR/DNI Disposition: ICU  Labs   CBC: No results for input(s): WBC, NEUTROABS, HGB, HCT, MCV, PLT in the last 168 hours.  Basic Metabolic Panel: No results for input(s): NA, K, CL, CO2, GLUCOSE, BUN, CREATININE, CALCIUM, MG, PHOS in the last 168 hours. GFR: CrCl cannot be calculated (Patient's most recent lab result is older than the maximum 21 days allowed.). No results for input(s): PROCALCITON, WBC,  LATICACIDVEN in the last 168 hours.  Liver Function Tests: No results for input(s): AST, ALT, ALKPHOS, BILITOT, PROT, ALBUMIN in the last 168 hours. No results for input(s): LIPASE, AMYLASE in the last 168 hours. No results for input(s): AMMONIA in the last 168 hours.  ABG No results found for: PHART, PCO2ART, PO2ART, HCO3, TCO2, ACIDBASEDEF, O2SAT   Coagulation Profile: No results for input(s): INR, PROTIME in the last 168 hours.  Cardiac Enzymes: No results for input(s): CKTOTAL, CKMB,  CKMBINDEX, TROPONINI in the last 168 hours.  HbA1C: No results found for: HGBA1C  CBG: Recent Labs  Lab 03/09/20 1543  GLUCAP 107*    Review of Systems:   Unable to obtain.  Surgical History    Past Surgical History:  Procedure Laterality Date  . TONSILLECTOMY    . TOTAL KNEE ARTHROPLASTY Right 01/21/2016   Procedure: RIGHT TOTAL KNEE ARTHROPLASTY;  Surgeon: Vickey Huger, MD;  Location: [facility];  Service: Orthopedics;  Laterality: Right;     Social History  Unable to obtain.  Family History   Unable to obtain.  Allergies Allergies  Allergen Reactions  . Other     NO BLOOD OR BLOOD PRODUCTS     Home Medications  Prior to Admission medications   Medication Sig Start Date End Date Taking? Authorizing Provider  enoxaparin (LOVENOX) 40 MG/0.4ML injection Inject 0.4 mLs (40 mg total) into the skin daily. 01/22/16   Carlynn Spry, PA-C  memantine (NAMENDA XR) 28 MG CP24 24 hr capsule Take 28 mg by mouth daily.    [provider]  methocarbamol (ROBAXIN) 500 MG tablet Take 1-2 tablets (500-1,000 mg total) by mouth every 6 (six) hours as needed for muscle spasms. 01/22/16   Carlynn Spry, PA-C  Multiple Vitamins-Minerals (MULTIVITAMIN WITH MINERALS) tablet Take 1 tablet by mouth daily.    [provider]  oxyCODONE (OXY IR/ROXICODONE) 5 MG immediate release tablet Take 1-2 tablets (5-10 mg total) by mouth every 3 (three) hours as needed for breakthrough pain. 01/22/16   Carlynn Spry, PA-C  oxyCODONE (OXYCONTIN) 10 mg 12 hr tablet Take 1 tablet (10 mg total) by mouth every 12 (twelve) hours. 01/22/16   Carlynn Spry, PA-C  rivastigmine (EXELON) 4.5 MG capsule Take 4.5 mg by mouth 2 (two) times daily.    [provider]  venlafaxine XR (EFFEXOR-XR) 75 MG 24 hr capsule Take 75 mg by mouth daily with breakfast.    [provider]  Vitamin D, Ergocalciferol, (DRISDOL) 50000 units CAPS capsule Take 50,000 Units by mouth every 7 (seven) days. Friday     [provider]     Critical care time: 27 minutes    Chesley Mires, MD [place] 03/09/2020, 4:24 PM

## 2020-03-09 NOTE — Progress Notes (Signed)
eLink Physician-Brief Progress Note Patient Name: Brianna Pineda DOB: 1932/02/23 MRN: [id number]   Date of Service  03/09/2020  HPI/Events of Note  Pt with advanced dementia admitted with PE who is constantly climbing out of bed, representing a fall risk.  eICU Interventions  Sitter ordered for safety.        Frederik Pear 03/09/2020, 8:44 PM

## 2020-03-09 NOTE — Progress Notes (Signed)
  NURSING PROGRESS NOTE  Brianna Pineda [id number] Transfer Data: 03/09/2020 5:43 PM Attending Provider: Chesley Mires, MD [id number], Brianna Havens, MD Code Status: DNR   Brianna Pineda is a 86 y.o. female patient transferred from [facility] via EMS.    Cardiac Monitoring: Box # [id number] in place. Cardiac monitor yields:atrial fibrillation, with ventricular rate of 120s, provider made aware.  Allergies:  Other  Past Medical History:   has a past medical history of Anxiety, Arthritis, Cancer ([place]), Dementia ([place]), Depression, Patient is Jehovah's Witness, Pulmonary embolism ([place]) (02/2020), and Sleep apnea.  Past Surgical History:   has a past surgical history that includes Tonsillectomy and Total knee arthroplasty (Right, 01/21/2016).  Social History:   reports that she has never smoked. She does not have any smokeless tobacco history on file. She reports current alcohol use. She reports that she does not use drugs.  Patient/Family orientated to room. Information packet given to patient/family. Admission inpatient armband information verified with patient/family to include name and date of birth and placed on patient arm. Side rails up x 2, fall assessment and education completed with patient/family. Patient/family able to verbalize understanding of risk associated with falls and verbalized understanding to call for assistance before getting out of bed. Call light within reach. Patient/family able to voice and demonstrate understanding of unit orientation instructions.    Will continue to evaluate and treat per MD orders.    03/09/20 1545  Vitals  Temp 98 F (36.7 C)  Temp Source Axillary  BP (!) 172/82  MAP (mmHg) 107  BP Location Left Arm  BP Method Automatic  Patient Position (if appropriate) Sitting  Pulse Rate 88  ECG Heart Rate 89  Resp (!) 30  Oxygen Therapy  SpO2 97 %  O2 Device Nasal Cannula  O2 Flow Rate (L/min) 6 L/min  Pain Assessment  Pain Scale 0-10  Pain Score 0   Height and Weight  Height 5\' 6"  (1.676 m)  Weight 110.1 kg  BSA (Calculated - sq m) 2.26 sq meters  BMI (Calculated) 39.2  Weight in (lb) to have BMI = 25 154.6  MEWS Score  MEWS Temp 0  MEWS Systolic 0  MEWS Pulse 0  MEWS RR 2  MEWS LOC 0  MEWS Score 2  MEWS Score Color Yellow  EKG monitor reading A-fib, patients lips were blue while on 2L [place], SATS in 70's, increased to 6L [place],  provider made aware.    Will cont to eval and treat per MD orders.  17:11- patient sustaining heart rate of 145 to 160 bpm, EKG obtained, provider notified of findings.   [place], RN 03/09/2020 5:28 PM

## 2020-03-09 NOTE — Progress Notes (Signed)
ANTICOAGULATION CONSULT NOTE - Initial Consult  Pharmacy Consult for heparin  Indication: pulmonary embolus  Allergies  Allergen Reactions  . Other     NO BLOOD OR BLOOD PRODUCTS    Patient Measurements: Wt= 110kg Ht= 5' 6''  IBW= 59 Heparin dosing wt: 85kg    Vital Signs: Temp: 98 F (36.7 C) (03/26 1545) Temp Source: Axillary (03/26 1545) BP: 172/82 (03/26 1545) Pulse Rate: 88 (03/26 1545)  Labs: No results for input(s): HGB, HCT, PLT, APTT, LABPROT, INR, HEPARINUNFRC, HEPRLOWMOCWT, CREATININE, CKTOTAL, CKMB, TROPONINIHS in the last 72 hours.  CrCl cannot be calculated (Patient's most recent lab result is older than the maximum 21 days allowed.).   Medical History: Past Medical History:  Diagnosis Date  . Anxiety   . Arthritis   . Cancer ([place])    on nose,removed  . Dementia   . Depression   . Shortness of breath dyspnea   . Sleep apnea    cpap    Assessment: 86 yo female with SOB and CT showing saddle PE. Pharmacy consulted to dose heparin. No anticoagulants noted PTA -labs pending  Goal of Therapy:  Heparin level 0.3-0.7 units/ml Monitor platelets by anticoagulation protocol: Yes   Plan:  -Heparin bolus 5000  units IV followed by 1400 units/hr -Heparin level in 8 hours and daily wth CBC daily  Hildred Laser, PharmD Clinical Pharmacist **Pharmacist phone directory can now be found on [email or web address] ([email or web address]).  Listed under [facility].

## 2020-03-10 ENCOUNTER — Inpatient Hospital Stay (HOSPITAL_COMMUNITY): Payer: Medicare HMO

## 2020-03-10 DIAGNOSIS — I10 Essential (primary) hypertension: Secondary | ICD-10-CM

## 2020-03-10 DIAGNOSIS — F0391 Unspecified dementia with behavioral disturbance: Secondary | ICD-10-CM

## 2020-03-10 DIAGNOSIS — J9601 Acute respiratory failure with hypoxia: Secondary | ICD-10-CM

## 2020-03-10 DIAGNOSIS — I5032 Chronic diastolic (congestive) heart failure: Secondary | ICD-10-CM

## 2020-03-10 DIAGNOSIS — I2699 Other pulmonary embolism without acute cor pulmonale: Secondary | ICD-10-CM

## 2020-03-10 DIAGNOSIS — I2602 Saddle embolus of pulmonary artery with acute cor pulmonale: Secondary | ICD-10-CM

## 2020-03-10 LAB — COMPREHENSIVE METABOLIC PANEL
ALT: 15 U/L (ref 0–44)
AST: 18 U/L (ref 15–41)
Albumin: 3.1 g/dL — ABNORMAL LOW (ref 3.5–5.0)
Alkaline Phosphatase: 55 U/L (ref 38–126)
Anion gap: 13 (ref 5–15)
BUN: 30 mg/dL — ABNORMAL HIGH (ref 8–23)
CO2: 24 mmol/L (ref 22–32)
Calcium: 9 mg/dL (ref 8.9–10.3)
Chloride: 102 mmol/L (ref 98–111)
Creatinine, Ser: 1.5 mg/dL — ABNORMAL HIGH (ref 0.44–1.00)
GFR calc Af Amer: 36 mL/min — ABNORMAL LOW (ref >60)
GFR calc non Af Amer: 31 mL/min — ABNORMAL LOW (ref >60)
Glucose, Bld: 104 mg/dL — ABNORMAL HIGH (ref 70–99)
Potassium: 4.1 mmol/L (ref 3.5–5.1)
Sodium: 139 mmol/L (ref 135–145)
Total Bilirubin: 1.1 mg/dL (ref 0.3–1.2)
Total Protein: 6.9 g/dL (ref 6.5–8.1)

## 2020-03-10 LAB — BRAIN NATRIURETIC PEPTIDE: B Natriuretic Peptide: 935.9 pg/mL — ABNORMAL HIGH (ref 0.0–100.0)

## 2020-03-10 LAB — GLUCOSE, CAPILLARY
Glucose-Capillary: 98 mg/dL (ref 70–99)
Glucose-Capillary: 123 mg/dL — ABNORMAL HIGH (ref 70–99)
Glucose-Capillary: 114 mg/dL — ABNORMAL HIGH (ref 70–99)

## 2020-03-10 LAB — HEPARIN LEVEL (UNFRACTIONATED)
Heparin Unfractionated: 0.44 IU/mL (ref 0.30–0.70)
Heparin Unfractionated: 0.45 IU/mL (ref 0.30–0.70)

## 2020-03-10 LAB — CBC
HCT: 38 % (ref 36.0–46.0)
Hemoglobin: 12.3 g/dL (ref 12.0–15.0)
MCH: 30.1 pg (ref 26.0–34.0)
MCHC: 32.4 g/dL (ref 30.0–36.0)
MCV: 93.1 fL (ref 80.0–100.0)
Platelets: 269 10*3/uL (ref 150–400)
RBC: 4.08 MIL/uL (ref 3.87–5.11)
RDW: 13.2 % (ref 11.5–15.5)
WBC: 10.8 10*3/uL — ABNORMAL HIGH (ref 4.0–10.5)
nRBC: 0 % (ref 0.0–0.2)

## 2020-03-10 LAB — ECHOCARDIOGRAM COMPLETE
Height: 66 in
Weight: 3834.24 oz

## 2020-03-10 MED ORDER — FUROSEMIDE 40 MG PO TABS
40.0000 mg | ORAL_TABLET | Freq: Every day | ORAL | Status: DC
  Administered 2020-03-10 – 2020-03-14 (×4): 40 mg via ORAL
  Filled 2020-03-10 (×5): qty 1

## 2020-03-10 MED ORDER — CHLORHEXIDINE GLUCONATE CLOTH 2 % EX PADS
6.0000 | MEDICATED_PAD | Freq: Every day | CUTANEOUS | Status: DC
  Administered 2020-03-10 – 2020-03-14 (×5): 6 via TOPICAL

## 2020-03-10 NOTE — Progress Notes (Signed)
[facility] for Heparin  Indication: pulmonary embolus  Allergies  Allergen Reactions  . Other     NO BLOOD OR BLOOD PRODUCTS - Jehovah's witness    Patient Measurements: Wt= 110kg Ht= 5' 6''  IBW= 59 Heparin dosing wt: 85kg    Vital Signs: Temp: 97.6 F (36.4 C) (03/27 1113) Temp Source: Oral (03/27 1113) BP: 153/68 (03/27 1230) Pulse Rate: 88 (03/27 1230)  Labs: Recent Labs    03/10/20 0332 03/10/20 1140  HGB 12.3  --   HCT 38.0  --   PLT 269  --   HEPARINUNFRC 0.44 0.45  CREATININE 1.50*  --     Estimated Creatinine Clearance: 34.2 mL/min (A) (by C-G formula based on SCr of 1.5 mg/dL (H)).   Medical History: Past Medical History:  Diagnosis Date  . Anxiety   . Arthritis   . Cancer ([place])    on nose,removed  . Dementia ([place])   . Depression   . Patient is Jehovah's Witness   . Pulmonary embolism ([place]) 02/2020  . Sleep apnea    cpap    Assessment: 86 yo female with SOB and CT showing saddle PE. Pharmacy consulted to dose heparin. No anticoagulants noted PTA   Heparin level therapeutic x2 (0.44>0.45)  Goal of Therapy:  Heparin level 0.3-0.7 units/ml Monitor platelets by anticoagulation protocol: Yes   Plan:  Cont heparin at 1500 units/hr Daily HL and CBC  Nicoletta Dress, PharmD PGY2 Infectious Disease Pharmacy Resident

## 2020-03-10 NOTE — Progress Notes (Signed)
   NAME:  Brianna Pineda, MRN:  [id number], DOB:  13-May-1932, LOS: 1 ADMISSION DATE:  03/09/2020, CONSULTATION DATE:  3/26 REFERRING MD:  [facility], CHIEF COMPLAINT:  sob  Brief History   86 yo female with hx of dementia transferred from [place] with acute PE with RV strain.  Significant Diagnostic Tests:  CT angio chest 3/26 [facility]) >> b/l saddle PE with RV:LV ratio 1.23 and Lt basilar ATX with small effusion  Micro Data:  SARS CoV2 PCR 3/26 [facility]) >> negative  Antimicrobials:  None  Interim history/subjective:  3/26 Transfer from [place]. Overnight delirious and impulsive, required tele-sitter. Stable on oxygen requirements 5-6LNC  Objective   Blood pressure (!) 124/95, pulse (!) 114, temperature 97.7 F (36.5 C), temperature source Oral, resp. rate (!) 30, height 5\' 6"  (1.676 m), weight 108.7 kg, SpO2 98 %.        Intake/Output Summary (Last 24 hours) at 03/10/2020 0945 Last data filed at 03/10/2020 0600 Gross per 24 hour  Intake 66.37 ml  Output 1300 ml  Net -1233.63 ml   Filed Weights   03/09/20 1545 03/10/20 0500  Weight: 110.1 kg 108.7 kg    Examination: General: alert HENT: mmm Lungs: diminished bilaterally Cardiovascular: tachycardic, regular, no gallops or rubs Abdomen: soft, nontender Extremities: 2+ pitting edema Neuro: follows commands  Resolved Hospital Problem list    Assessment & Plan:  Brianna Pineda is a 86 y.o. woman with history of dementia who presents with:  Acute hypoxemic respiratory failure secondary to Acute submassive pulmonary embolism - on heparin gtt - age precludes half dose systemic thrombolytic therapy (risks vs benefits discussed with patient's husband) - consider IVC filter if she is unable to tolerate heparin - would need lower extremity dopplers to see if there is dvt burden - wean oxygen as tolerated  HTN - continue home meds, lisinopril. Resume po lasix today.   Jehovah's Witness - accepts no blood  products  Disposition - ok for transfer to medical telemetry bed.  - will sign out to [facility] - PCCM to follow as needed starting 3/28.    Lenice Llamas, MD Pulmonary and [place] Pager: [place]   CBC: Recent Labs  Lab 03/10/20 0332  WBC 10.8*  HGB 12.3  HCT 38.0  MCV 93.1  PLT [id number]    Basic Metabolic Panel: Recent Labs  Lab 03/10/20 0332  NA 139  K 4.1  CL 102  CO2 24  GLUCOSE 104*  BUN 30*  CREATININE 1.50*  CALCIUM 9.0   GFR: Estimated Creatinine Clearance: 34.2 mL/min (A) (by C-G formula based on SCr of 1.5 mg/dL (H)). Recent Labs  Lab 03/10/20 0332  WBC 10.8*    Liver Function Tests: Recent Labs  Lab 03/10/20 0332  AST 18  ALT 15  ALKPHOS 55  BILITOT 1.1  PROT 6.9  ALBUMIN 3.1*   No results for input(s): LIPASE, AMYLASE in the last 168 hours. No results for input(s): AMMONIA in the last 168 hours.  ABG No results found for: PHART, PCO2ART, PO2ART, HCO3, TCO2, ACIDBASEDEF, O2SAT   Coagulation Profile: No results for input(s): INR, PROTIME in the last 168 hours.  Cardiac Enzymes: No results for input(s): CKTOTAL, CKMB, CKMBINDEX, TROPONINI in the last 168 hours.  HbA1C: No results found for: HGBA1C  CBG: Recent Labs  Lab 03/09/20 1543 03/09/20 1935 03/09/20 2337 03/10/20 0340 03/10/20 0720  GLUCAP 107* 110* 107* 98 123*

## 2020-03-10 NOTE — Progress Notes (Signed)
VASCULAR LAB PRELIMINARY  PRELIMINARY  PRELIMINARY  PRELIMINARY  Bilateral lower extremity venous duplex completed.    Preliminary report:  See CV proc for preliminary results.  Messaged Dr. Shearon Stalls with results.  Glendy Barsanti, RVT 03/10/2020, 12:00 PM

## 2020-03-10 NOTE — Progress Notes (Signed)
  Echocardiogram 2D Echocardiogram has been performed.  Brianna Pineda 03/10/2020, 1:50 PM

## 2020-03-10 NOTE — Progress Notes (Signed)
[place] for Heparin  Indication: pulmonary embolus  Allergies  Allergen Reactions  . Other     NO BLOOD OR BLOOD PRODUCTS - Jehovah's witness    Patient Measurements: Wt= 110kg Ht= 5' 6''  IBW= 59 Heparin dosing wt: 85kg    Vital Signs: Temp: 97.8 F (36.6 C) (03/27 0342) Temp Source: Oral (03/27 0342) BP: 155/111 (03/27 0100) Pulse Rate: 79 (03/27 0142)  Labs: Recent Labs    03/10/20 0332  HGB 12.3  HCT 38.0  PLT 269  HEPARINUNFRC 0.44  CREATININE 1.50*    Estimated Creatinine Clearance: 34.5 mL/min (A) (by C-G formula based on SCr of 1.5 mg/dL (H)).   Medical History: Past Medical History:  Diagnosis Date  . Anxiety   . Arthritis   . Cancer ([place])    on nose,removed  . Dementia ([place])   . Depression   . Patient is Jehovah's Witness   . Pulmonary embolism ([place]) 02/2020  . Sleep apnea    cpap    Assessment: 86 yo female with SOB and CT showing saddle PE. Pharmacy consulted to dose heparin. No anticoagulants noted PTA -labs pending  3/27 AM update:  Initial heparin level therapeutic  CBC ok  Goal of Therapy:  Heparin level 0.3-0.7 units/ml Monitor platelets by anticoagulation protocol: Yes   Plan:  Cont heparin at 1500 units/hr 1200 confirmatory heparin level   Narda Bonds, PharmD, BCPS Clinical Pharmacist Phone: [phone]

## 2020-03-10 NOTE — Progress Notes (Signed)
Pt prefers to wear home machine. Will attempt to get pt machine and place. RT will continue to monitor.

## 2020-03-10 NOTE — Progress Notes (Signed)
eLink Physician-Brief Progress Note Patient Name: Christana Tolentino Burkhead DOB: 17-Jul-1932 MRN: [id number]   Date of Service  03/10/2020  HPI/Events of Note  Pt intermittently agitated and has pulled out several  Iv's, she's also tried to get out of bed.  eICU Interventions  Bilateral soft wrist restraints ordered        Ezrah Dembeck U Hayven Croy 03/10/2020, 5:05 AM

## 2020-03-11 DIAGNOSIS — I82402 Acute embolism and thrombosis of unspecified deep veins of left lower extremity: Secondary | ICD-10-CM

## 2020-03-11 DIAGNOSIS — I5033 Acute on chronic diastolic (congestive) heart failure: Secondary | ICD-10-CM

## 2020-03-11 LAB — CBC
HCT: 39 % (ref 36.0–46.0)
Hemoglobin: 12.7 g/dL (ref 12.0–15.0)
MCH: 30.2 pg (ref 26.0–34.0)
MCHC: 32.6 g/dL (ref 30.0–36.0)
MCV: 92.6 fL (ref 80.0–100.0)
Platelets: 302 10*3/uL (ref 150–400)
RBC: 4.21 MIL/uL (ref 3.87–5.11)
RDW: 13.1 % (ref 11.5–15.5)
WBC: 10.1 10*3/uL (ref 4.0–10.5)
nRBC: 0 % (ref 0.0–0.2)

## 2020-03-11 LAB — COMPREHENSIVE METABOLIC PANEL
ALT: 20 U/L (ref 0–44)
AST: 26 U/L (ref 15–41)
Albumin: 2.8 g/dL — ABNORMAL LOW (ref 3.5–5.0)
Alkaline Phosphatase: 50 U/L (ref 38–126)
Anion gap: 12 (ref 5–15)
BUN: 33 mg/dL — ABNORMAL HIGH (ref 8–23)
CO2: 25 mmol/L (ref 22–32)
Calcium: 8.9 mg/dL (ref 8.9–10.3)
Chloride: 104 mmol/L (ref 98–111)
Creatinine, Ser: 1.12 mg/dL — ABNORMAL HIGH (ref 0.44–1.00)
GFR calc Af Amer: 52 mL/min — ABNORMAL LOW (ref >60)
GFR calc non Af Amer: 45 mL/min — ABNORMAL LOW (ref >60)
Glucose, Bld: 132 mg/dL — ABNORMAL HIGH (ref 70–99)
Potassium: 4.4 mmol/L (ref 3.5–5.1)
Sodium: 141 mmol/L (ref 135–145)
Total Bilirubin: 0.9 mg/dL (ref 0.3–1.2)
Total Protein: 6.8 g/dL (ref 6.5–8.1)

## 2020-03-11 LAB — HEPARIN LEVEL (UNFRACTIONATED): Heparin Unfractionated: 0.37 IU/mL (ref 0.30–0.70)

## 2020-03-11 LAB — MAGNESIUM: Magnesium: 2.5 mg/dL — ABNORMAL HIGH (ref 1.7–2.4)

## 2020-03-11 LAB — TROPONIN I (HIGH SENSITIVITY)
Troponin I (High Sensitivity): 59 ng/L — ABNORMAL HIGH (ref <18)
Troponin I (High Sensitivity): 58 ng/L — ABNORMAL HIGH (ref <18)

## 2020-03-11 LAB — PHOSPHORUS: Phosphorus: 5 mg/dL — ABNORMAL HIGH (ref 2.5–4.6)

## 2020-03-11 MED ORDER — HALOPERIDOL LACTATE 5 MG/ML IJ SOLN
0.5000 mg | Freq: Once | INTRAMUSCULAR | Status: AC
  Administered 2020-03-11: 0.5 mg via INTRAVENOUS
  Filled 2020-03-11: qty 1

## 2020-03-11 MED ORDER — MENTHOL 3 MG MT LOZG
1.0000 | LOZENGE | OROMUCOSAL | Status: DC | PRN
  Administered 2020-03-12 – 2020-03-13 (×2): 3 mg via ORAL
  Filled 2020-03-11 (×3): qty 9

## 2020-03-11 NOTE — Evaluation (Signed)
Clinical/Bedside Swallow Evaluation Patient Details  Name: Brianna Pineda MRN: [id number] Date of Birth: [birth year]  Today's Date: 03/11/2020 Time: SLP Start Time (ACUTE ONLY): [id number] SLP Stop Time (ACUTE ONLY): 1405 SLP Time Calculation (min) (ACUTE ONLY): 22 min  Past Medical History:  Past Medical History:  Diagnosis Date  . Anxiety   . Arthritis   . Cancer ([place])    on nose,removed  . Dementia ([place])   . Depression   . Patient is Jehovah's Witness   . Pulmonary embolism ([place]) 02/2020  . Sleep apnea    cpap   Past Surgical History:  Past Surgical History:  Procedure Laterality Date  . TONSILLECTOMY    . TOTAL KNEE ARTHROPLASTY Right 01/21/2016   Procedure: RIGHT TOTAL KNEE ARTHROPLASTY;  Surgeon: Vickey Huger, MD;  Location: [facility];  Service: Orthopedics;  Laterality: Right;   HPI:  Pt is an 86 yo female with hx of dementia transferred from [place] with acute PE with RV strain.   Assessment / Plan / Recommendation Clinical Impression  Pt is too lethargic for PO trials at this time, arousing to verbal/tactile stimulation but not able to sustain alertness to take in any boluses. Pt's family and her RN describe no overt s/s of dysphagia when she has been more alert, so will hold on making any diet changes at the moment. However, I did provide education to the family about s/s of aspiration for which to monitor. Aspiration precautions were also reviewed in light of increased risk in the setting of AMS, dementia, and current deconditioning with decreased respiratory status. They verbalized their agreement. Of note, family is also asking for visitation exceptions to be able to stay overnight with pt - her RN was informed of this request and says that she is already aware. SLP will f/u as pt becomes more alert.   SLP Visit Diagnosis: Dysphagia, unspecified (R13.10)    Aspiration Risk  Moderate aspiration risk    Diet Recommendation Regular;Thin liquid(continue current diet pending further  evaluation)   Liquid Administration via: Cup;Straw Medication Administration: Whole meds with liquid Supervision: Patient able to self feed;Intermittent supervision to cue for compensatory strategies Compensations: Small sips/bites;Slow rate;Minimize environmental distractions Postural Changes: Seated upright at 90 degrees    Other  Recommendations Oral Care Recommendations: Oral care QID Other Recommendations: Have oral suction available   Follow up Recommendations (tba)      Frequency and Duration min 2x/week  2 weeks       Prognosis Prognosis for Safe Diet Advancement: Good Barriers to Reach Goals: Cognitive deficits      Swallow Study   General HPI: Pt is an 86 yo female with hx of dementia transferred from [place] with acute PE with RV strain. Type of Study: Bedside Swallow Evaluation Previous Swallow Assessment: none in chart Diet Prior to this Study: Regular;Thin liquids Temperature Spikes Noted: No Respiratory Status: Nasal cannula History of Recent Intubation: No Behavior/Cognition: Lethargic/Drowsy Oral Cavity Assessment: Dry Oral Care Completed by SLP: No Oral Cavity - Dentition: Adequate natural dentition Self-Feeding Abilities: Total assist Patient Positioning: Upright in bed Baseline Vocal Quality: Normal Volitional Swallow: Unable to elicit    Oral/Motor/Sensory Function     Ice Chips Ice chips: Not tested   Thin Liquid Thin Liquid: Not tested    Nectar Thick Nectar Thick Liquid: Not tested   Honey Thick Honey Thick Liquid: Not tested   Puree Puree: Not tested   Solid     Solid: Not tested  Osie Bond., M.A. [place] Pager [phone] Office [phone]  03/11/2020,2:16 PM

## 2020-03-11 NOTE — Progress Notes (Signed)
eLink Physician-Brief Progress Note Patient Name: Brianna Pineda DOB: [birth year] MRN: [id number]   Date of Service  03/11/2020  HPI/Events of Note  Patient is agitated.  eICU Interventions  Haldol 0.5 mg iv x 1        Othniel Maret U Makye Radle 03/11/2020, 3:52 AM

## 2020-03-11 NOTE — Plan of Care (Signed)

## 2020-03-11 NOTE — Progress Notes (Signed)
ANTICOAGULATION CONSULT NOTE - Follow-Up  Pharmacy Consult for Heparin  Indication: pulmonary embolus, LLE DVT  Allergies  Allergen Reactions  . Other     NO BLOOD OR BLOOD PRODUCTS - Jehovah's witness    Patient Measurements: Wt= 110kg Ht= 5' 6''  IBW= 59 Heparin dosing wt: 85kg    Vital Signs: Temp: 98.5 F (36.9 C) (03/28 0740) Temp Source: Oral (03/27 2322) BP: 151/78 (03/28 0740) Pulse Rate: 88 (03/28 0740)  Labs: Recent Labs    03/10/20 0332 03/10/20 1140 03/11/20 0215 03/11/20 0834  HGB 12.3  --  12.7  --   HCT 38.0  --  39.0  --   PLT 269  --  302  --   HEPARINUNFRC 0.44 0.45 0.37  --   CREATININE 1.50*  --   --  1.12*    Estimated Creatinine Clearance: 45.9 mL/min (A) (by C-G formula based on SCr of 1.12 mg/dL (H)).   Medical History: Past Medical History:  Diagnosis Date  . Anxiety   . Arthritis   . Cancer ([place])    on nose,removed  . Dementia ([place])   . Depression   . Patient is Jehovah's Witness   . Pulmonary embolism ([place]) 02/2020  . Sleep apnea    cpap    Assessment: 86 yo female with SOB and CT (at [facility]) showing saddle PE. Dopplers also + LLE DVT.  Pharmacy consulted to dose heparin. No anticoagulants noted PTA  Heparin level remains therapeutic on 1500 units/hr.  Goal of Therapy:  Heparin level 0.3-0.7 units/ml Monitor platelets by anticoagulation protocol: Yes   Plan:  Continue heparin at 1500 units/hr Daily heparin level and CBC Follow-up plans for oral anticoagulation  Brianna Pineda, Pharm.D., BCPS Clinical Pharmacist Clinical phone for 03/11/2020 from 7:30-3:00 is [phone].  **Pharmacist phone directory can be found on [email or web address] listed under [place].  03/11/2020 9:21 AM

## 2020-03-11 NOTE — Progress Notes (Signed)
Pt confused/agitated per shift. Pt had sitter for a few hours but sitter got pulled. This nurse and CNA in room multiple times per shift. Pt in mittens due to pulling out multiple IVs and is trying to remove them. Reached out to on call provider x2 for medicine to help pt relax. Pt given Haldol that didn't seem to help.  Pt had a period of hypertension, afib RVR (120s-160s) per tele, and EKG. Pt given PRN and pt attempted to convert to sinus on their own.   Chat messaged attending provider with update. Dayshift nurse and daughter n law also aware.

## 2020-03-11 NOTE — Progress Notes (Signed)
Report called to [facility], RN on 2W. All questions answered. Pt transported to 2W.

## 2020-03-11 NOTE — Progress Notes (Signed)
PROGRESS NOTE    Brianna Pineda  [id number] DOB: 09-16-1932 DOA: 03/09/2020 PCP: Nicoletta Dress, MD   Brief Narrative:  HPI per Dr. Chesley Mires on 03/09/20 86 yo female seen by PCP for several days of dyspnea and fatigue.  She has baseline history of dementia and reportedly requires total care.  In ER at [facility] noted to have SpO2 88%.  ABG showed PaO2 62 on room air.  Creatinine 1.60, lactic acid 0.9, Troponin I 0.42, Hb 12.9, PLT 250.  CXR reports 2.3 x 2.2 cm opacity in Lt base.  CT angio chest showed b/l saddle PE with RV:LV ratio 1.23 and Lt basilar ATX with small effusion.  **Interim History  Was placed on a heparin drip in the night before last she became delirious and impulsive and required a telemetry sitter.  She remains on oxygen 5-6 L currently.  She is also found to have an extensive left leg DVT  Assessment & Plan:   Active Problems:   Acute pulmonary embolism (HCC)  Acute Hypoxemic Respiratory Failure secondary to Acute submassive pulmonary embolism with complications of an Acute Left Leg DVT -Admitted to PCCM and now transferred to [facility] on 03/11/20 -Per PCCM the patient's age precludes half dose systemic thrombolytic therapy (risks vs benefits discussed with patient's husband) and they are not going forward with Systemic Thrombolytic Therapy and it was deferred on admission -Will consider IVC filter at some point given that if she is unable to tolerate heparin and has potential bleeding complications related to fall risk  -PESI Class at least a 3 -Will continue Heparin gtt for at least 3 Days -Check Troponin Levels  -Lower Extremity Dopplers done and showed "Findings consistent with acute deep vein thrombosis involving the left femoral vein, left popliteal vein, left posterior tibial veins, and left  peroneal veins." -Continuous pulse oximetry and maintain O2 saturations greater than 90% -SpO2: 97 % O2 Flow Rate (L/min): 6 L/min -Continue supplemental oxygen via  nasal cannula and wean O2 as tolerated -ECHOCardiogram showed "Left ventricular ejection fraction, by estimation, is 55 to 60%. The left ventricle has normal function. The left ventricle has no regional wall motion abnormalities. The left ventricular internal cavity size was normal in size. There is no left ventricular hypertrophy. Left ventricular diastolic parameters are consistent with Grade I diastolic dysfunction (impaired relaxation). Right Ventricle: The right ventricular size is mildly enlarged. Right vetricular wall thickness was not assessed. Right ventricular systolic function is mildly reduced. There is moderately elevated pulmonary artery systolic pressure. The tricuspid regurgitant velocity is 3.18 m/s, and with an assumed right atrial pressure of 3 mmHg, the estimated right ventricular systolic pressure is [id number] mmHg." -Will need an Ambulatory Home O2 Screen prior to D/C   AKI on questionable stage III CKD Hyperphosphatemia  -Patient's BUN/creatinine went from 30/4.50 is now 33/1.12 -Continue with Lasix 40 mg p.o. daily -Avoid nephrotoxic medications, contrast dyes, hypotension and renally adjust medications -Repeat CMP in the a.m.  Acute on Chronic Diastolic CHF -Echo showed grade 1 diastolic dysfunction -BNP on admission was 935.9 -Chest x-ray in a.m. -Continue with Lasix 40 mg po Daily  -Patient is -[id number] Liters since admission -Continue to Monitor Volume Status carefully  History of OSA -Continue CPAP nightly  History of Dementia with Behavioral Disturbances Chronic pain -Obtained SLP and recommending Regular Diet with Thin Liquids  -Continue with memantine 10 mg p.o. twice daily, rivastigmine 9.5 mg transdermal patch, and venlafaxine XR 75 mg p.o. daily -Continue with methocarbamol [phone] mg  p.o. every 6 as needed muscle spasm -Continue safety sitter one-to-one -Given Haldol 0.5 mg IV once yesterday  HTN -Continue with lisinopril 20 mg p.o. daily and with  hydralazine 10 mg IV every 4 as needed for systolic blood pressure again 160 as was the Toprol tartrate 5 mg IV every 4 hours as needed for systolic blood pressure when 160 or heart rate greater than 110.   Jehovah's Witness - accepts no blood products  Leukocytosis -Mild at 10.8 and improved to 10.1 -In the setting of Acute PE  -Continue to monitor for signs and symptoms of infection; currently she is afebrile -Repeat CBC in the a.m.  Goals of care, DNR  -Patient's Daughter-In-Law Bedside -She has progressive dementia and has become more sedentary with increasing care needs -She is DNR/DNI -PCCM discussed risk/benefit ratio of thrombolytic therapy with husband >> he is in agreement to defer this unless clinical status gets worse  Obesity -Estimated body mass index is 38.68 kg/m as calculated from the following:   Height as of this encounter: 5\' 6"  (1.676 m).   Weight as of this encounter: 108.7 kg. -Weight Loss and Dietary Counseling given   DVT prophylaxis: Anticoagulated with a heparin drip Code Status: DO NOT RESUSCITATE  Family Communication: Discussed with the patient's daughter-in-law at bedside Disposition Plan: Will need PT/OT to Evaluate and Treat and be transitioned to po Anticoagulation; Will continue to Monitor for S/Sx of bleeding   Consultants:   PCCM Transfer  Procedures:  ECHOCARDIOGRAM IMPRESSIONS    1. Left ventricular ejection fraction, by estimation, is 55 to 60%. The  left ventricle has normal function. The left ventricle has no regional  wall motion abnormalities. Left ventricular diastolic parameters are  consistent with Grade I diastolic  dysfunction (impaired relaxation).  2. Right ventricular systolic function is mildly reduced. The right  ventricular size is mildly enlarged. There is moderately elevated  pulmonary artery systolic pressure. The estimated right ventricular  systolic pressure is [id number] mmHg.  3. The mitral valve is normal in  structure. No evidence of mitral valve  regurgitation. No evidence of mitral stenosis.  4. The aortic valve is normal in structure. Aortic valve regurgitation is  not visualized. No aortic stenosis is present.   FINDINGS  Left Ventricle: Left ventricular ejection fraction, by estimation, is 55  to 60%. The left ventricle has normal function. The left ventricle has no  regional wall motion abnormalities. The left ventricular internal cavity  size was normal in size. There is  no left ventricular hypertrophy. Left ventricular diastolic parameters  are consistent with Grade I diastolic dysfunction (impaired relaxation).   Right Ventricle: The right ventricular size is mildly enlarged. Right  vetricular wall thickness was not assessed. Right ventricular systolic  function is mildly reduced. There is moderately elevated pulmonary artery  systolic pressure. The tricuspid  regurgitant velocity is 3.18 m/s, and with an assumed right atrial  pressure of 3 mmHg, the estimated right ventricular systolic pressure is  [id number] mmHg.   Left Atrium: Left atrial size was normal in size.   Right Atrium: Right atrial size was normal in size.   Pericardium: There is no evidence of pericardial effusion.   Mitral Valve: The mitral valve is normal in structure. No evidence of  mitral valve regurgitation. No evidence of mitral valve stenosis.   Tricuspid Valve: The tricuspid valve is normal in structure. Tricuspid  valve regurgitation is trivial. No evidence of tricuspid stenosis.   Aortic Valve: The aortic valve is normal  in structure. Aortic valve  regurgitation is not visualized. No aortic stenosis is present.   Pulmonic Valve: The pulmonic valve was grossly normal. Pulmonic valve  regurgitation is not visualized. No evidence of pulmonic stenosis.   Aorta: The aortic root and ascending aorta are structurally normal, with  no evidence of dilitation.   IAS/Shunts: The atrial septum is grossly  normal.     LEFT VENTRICLE  PLAX 2D  LVIDd:     3.80 cm Diastology  LVIDs:     2.20 cm LV e' lateral:  9.03 cm/s  LV PW:     1.10 cm LV E/e' lateral: 8.2  LV IVS:    1.00 cm LV e' medial:  7.18 cm/s  LVOT diam:   2.00 cm LV E/e' medial: 10.3  LV SV:     61  LV SV Index:  28  LVOT Area:   3.14 cm     RIGHT VENTRICLE  RV S prime:   18.80 cm/s   LEFT ATRIUM       Index    RIGHT ATRIUM      Index  LA diam:    3.80 cm 1.76 cm/m RA Area:   15.30 cm  LA Vol (A2C):  45.2 ml 20.93 ml/m RA Volume:  34.90 ml 16.16 ml/m  LA Vol (A4C):  38.1 ml 17.64 ml/m  LA Biplane Vol: 41.6 ml 19.26 ml/m  AORTIC VALVE  LVOT Vmax:  118.00 cm/s  LVOT Vmean: 65.900 cm/s  LVOT VTI:  0.194 m    AORTA  Ao Root diam: 2.80 cm  Ao Asc diam: 3.10 cm   MITRAL VALVE        TRICUSPID VALVE  MV Area (PHT): 3.48 cm   TR Peak grad:  40.4 mmHg  MV Decel Time: 218 msec   TR Vmax:    318.00 cm/s  MV E velocity: 74.10 cm/s  MV A velocity: 104.00 cm/s SHUNTS  MV E/A ratio: 0.71     Systemic VTI: 0.19 m               Systemic Diam: 2.00 cm   LE VENOUS DUPLEX RIGHT:  - There is no evidence of deep vein thrombosis in the lower extremity.    LEFT:  - Findings consistent with acute deep vein thrombosis involving the left  femoral vein, left popliteal vein, left posterior tibial veins, and left  peroneal veins.    Antimicrobials:  Anti-infectives (From admission, onward)   None     Subjective: Seen And examined at bedside and she was slightly agitated but appropriately answering.  Denied any pain.  Has some dementia and daughter-in-law states it is worsened.  No nausea or vomiting.  Has not been very mobile.  No other concerns or complaints at this time.  Objective: Vitals:   03/11/20 0700 03/11/20 0740 03/11/20 1302 03/11/20 1638  BP: (!) 162/96 (!) 151/78 (!) 156/111 (!) 159/85  Pulse: 88  88  84  Resp:  (!) 24  19  Temp:  98.5 F (36.9 C)    TempSrc:      SpO2:  97%    Weight:      Height:        Intake/Output Summary (Last 24 hours) at 03/11/2020 1653 Last data filed at 03/10/2020 2200 Gross per 24 hour  Intake 74.9 ml  Output 0 ml  Net 74.9 ml   Filed Weights   03/09/20 1545 03/10/20 0500  Weight: 110.1 kg 108.7 kg   Examination: Physical  Exam:  Constitutional: WN/WD morbidly obese Caucasian female currently in in mild respiratory distress but appears little uncomfortable  Eyes: Lids and conjunctivae normal, sclerae anicteric  ENMT: External Ears, Nose appear normal. Grossly normal hearing.  Neck: Appears normal, supple, no cervical masses, normal ROM, no appreciable thyromegaly; no JVD Respiratory: Diminished to auscultation bilaterally with coarse breath sound, no wheezing, rales, rhonchi or crackles. Normal respiratory effort and patient is not tachypenic. No accessory muscle use.  Wearing 6 L of supplemental oxygen via nasal cannula Cardiovascular: Tachycardic rate but regular rhythm, has 1+ lower extremity edema bilaterally but slightly worse on the left compared to the right with painful palpation Abdomen: Soft, non-tender, distended second body habitus. Bowel sounds positive.  GU: Deferred. Musculoskeletal: No clubbing / cyanosis of digits/nails. No joint deformity upper and lower extremities. Skin: No rashes, lesions, ulcers on limited skin evaluation. No induration; Warm and dry.  Neurologic: CN 2-12 grossly intact with no focal deficits but did mumble some of her words. Romberg sign cerebellar reflexes not assessed.  Psychiatric: Slightly impaired judgment and insight. Alert and oriented x 2. Normal mood and appropriate affect.   Data Reviewed: I have personally reviewed following labs and imaging studies  CBC: Recent Labs  Lab 03/10/20 0332 03/11/20 0215  WBC 10.8* 10.1  HGB 12.3 12.7  HCT 38.0 39.0  MCV 93.1 92.6  PLT 269 [id number]   Basic  Metabolic Panel: Recent Labs  Lab 03/10/20 0332 03/11/20 0834  NA 139 141  K 4.1 4.4  CL 102 104  CO2 24 25  GLUCOSE 104* 132*  BUN 30* 33*  CREATININE 1.50* 1.12*  CALCIUM 9.0 8.9  MG  --  2.5*  PHOS  --  5.0*   GFR: Estimated Creatinine Clearance: 45.9 mL/min (A) (by C-G formula based on SCr of 1.12 mg/dL (H)). Liver Function Tests: Recent Labs  Lab 03/10/20 0332 03/11/20 0834  AST 18 26  ALT 15 20  ALKPHOS 55 50  BILITOT 1.1 0.9  PROT 6.9 6.8  ALBUMIN 3.1* 2.8*   No results for input(s): LIPASE, AMYLASE in the last 168 hours. No results for input(s): AMMONIA in the last 168 hours. Coagulation Profile: No results for input(s): INR, PROTIME in the last 168 hours. Cardiac Enzymes: No results for input(s): CKTOTAL, CKMB, CKMBINDEX, TROPONINI in the last 168 hours. BNP (last 3 results) No results for input(s): PROBNP in the last 8760 hours. HbA1C: No results for input(s): HGBA1C in the last 72 hours. CBG: Recent Labs  Lab 03/09/20 1935 03/09/20 2337 03/10/20 0340 03/10/20 0720 03/10/20 1112  GLUCAP 110* 107* 98 123* 114*   Lipid Profile: No results for input(s): CHOL, HDL, LDLCALC, TRIG, CHOLHDL, LDLDIRECT in the last 72 hours. Thyroid Function Tests: No results for input(s): TSH, T4TOTAL, FREET4, T3FREE, THYROIDAB in the last 72 hours. Anemia Panel: No results for input(s): VITAMINB12, FOLATE, FERRITIN, TIBC, IRON, RETICCTPCT in the last 72 hours. Sepsis Labs: No results for input(s): PROCALCITON, LATICACIDVEN in the last 168 hours.  Recent Results (from the past 240 hour(s))  MRSA PCR Screening     Status: None   Collection Time: 03/09/20  3:40 PM   Specimen: Nasopharyngeal  Result Value Ref Range Status   MRSA by PCR NEGATIVE NEGATIVE Final    Comment:        The GeneXpert MRSA Assay (FDA approved for NASAL specimens only), is one component of a comprehensive MRSA colonization surveillance program. It is not intended to diagnose MRSA infection  nor to guide  or monitor treatment for MRSA infections. Performed at [facility], [place] [address] [place], [place] [address]     RN Pressure Injury Documentation:     Estimated body mass index is 38.68 kg/m as calculated from the following:   Height as of this encounter: 5\' 6"  (1.676 m).   Weight as of this encounter: 108.7 kg.  Malnutrition Type:      Malnutrition Characteristics:      Nutrition Interventions:    Radiology Studies: ECHOCARDIOGRAM COMPLETE  Result Date: 03/10/2020    ECHOCARDIOGRAM REPORT   Patient Name:   Quinlynn ANN Kerchner Date of Exam: 03/10/2020 Medical Rec #:  [id number]    Height:       66.0 in Accession #:    [id number]   Weight:       239.6 lb Date of Birth:  [birth year]    BSA:          2.160 m Patient Age:    71 years     BP:           133/117 mmHg Patient Gender: F            HR:           87 bpm. Exam Location:  Inpatient Procedure: 2D Echo Indications:    pulmonary embolism  History:        Patient has no prior history of Echocardiogram examinations.  Sonographer:    Johny Chess Referring Phys: Toney Sang SOOD  Sonographer Comments: Patient is morbidly obese. Image acquisition challenging due to respiratory motion and Image acquisition challenging due to patient body habitus. IMPRESSIONS  1. Left ventricular ejection fraction, by estimation, is 55 to 60%. The left ventricle has normal function. The left ventricle has no regional wall motion abnormalities. Left ventricular diastolic parameters are consistent with Grade I diastolic dysfunction (impaired relaxation).  2. Right ventricular systolic function is mildly reduced. The right ventricular size is mildly enlarged. There is moderately elevated pulmonary artery systolic pressure. The estimated right ventricular systolic pressure is [id number] mmHg.  3. The mitral valve is normal in structure. No evidence of mitral valve regurgitation. No evidence of mitral stenosis.  4. The aortic valve is normal in  structure. Aortic valve regurgitation is not visualized. No aortic stenosis is present. FINDINGS  Left Ventricle: Left ventricular ejection fraction, by estimation, is 55 to 60%. The left ventricle has normal function. The left ventricle has no regional wall motion abnormalities. The left ventricular internal cavity size was normal in size. There is  no left ventricular hypertrophy. Left ventricular diastolic parameters are consistent with Grade I diastolic dysfunction (impaired relaxation). Right Ventricle: The right ventricular size is mildly enlarged. Right vetricular wall thickness was not assessed. Right ventricular systolic function is mildly reduced. There is moderately elevated pulmonary artery systolic pressure. The tricuspid regurgitant velocity is 3.18 m/s, and with an assumed right atrial pressure of 3 mmHg, the estimated right ventricular systolic pressure is [id number] mmHg. Left Atrium: Left atrial size was normal in size. Right Atrium: Right atrial size was normal in size. Pericardium: There is no evidence of pericardial effusion. Mitral Valve: The mitral valve is normal in structure. No evidence of mitral valve regurgitation. No evidence of mitral valve stenosis. Tricuspid Valve: The tricuspid valve is normal in structure. Tricuspid valve regurgitation is trivial. No evidence of tricuspid stenosis. Aortic Valve: The aortic valve is normal in structure. Aortic valve regurgitation is not visualized. No aortic stenosis is present. Pulmonic Valve: The pulmonic  valve was grossly normal. Pulmonic valve regurgitation is not visualized. No evidence of pulmonic stenosis. Aorta: The aortic root and ascending aorta are structurally normal, with no evidence of dilitation. IAS/Shunts: The atrial septum is grossly normal.  LEFT VENTRICLE PLAX 2D LVIDd:         3.80 cm  Diastology LVIDs:         2.20 cm  LV e' lateral:   9.03 cm/s LV PW:         1.10 cm  LV E/e' lateral: 8.2 LV IVS:        1.00 cm  LV e' medial:    7.18  cm/s LVOT diam:     2.00 cm  LV E/e' medial:  10.3 LV SV:         61 LV SV Index:   28 LVOT Area:     3.14 cm  RIGHT VENTRICLE RV S prime:     18.80 cm/s LEFT ATRIUM             Index       RIGHT ATRIUM           Index LA diam:        3.80 cm 1.76 cm/m  RA Area:     15.30 cm LA Vol (A2C):   45.2 ml 20.93 ml/m RA Volume:   34.90 ml  16.16 ml/m LA Vol (A4C):   38.1 ml 17.64 ml/m LA Biplane Vol: 41.6 ml 19.26 ml/m  AORTIC VALVE LVOT Vmax:   118.00 cm/s LVOT Vmean:  65.900 cm/s LVOT VTI:    0.194 m  AORTA Ao Root diam: 2.80 cm Ao Asc diam:  3.10 cm MITRAL VALVE                TRICUSPID VALVE MV Area (PHT): 3.48 cm     TR Peak grad:   40.4 mmHg MV Decel Time: 218 msec     TR Vmax:        318.00 cm/s MV E velocity: 74.10 cm/s MV A velocity: 104.00 cm/s  SHUNTS MV E/A ratio:  0.71         Systemic VTI:  0.19 m                             Systemic Diam: 2.00 cm Mertie Moores MD Electronically signed by Mertie Moores MD Signature Date/Time: 03/10/2020/4:05:59 PM    Final    VAS [place] LOWER EXTREMITY VENOUS (DVT)  Result Date: 03/10/2020  Lower Venous DVTStudy Indications: Pulmonary embolism.  Comparison Study: No prior study on file Performing Technologist: Sharion Dove RVS  Examination Guidelines: A complete evaluation includes B-mode imaging, spectral Doppler, color Doppler, and power Doppler as needed of all accessible portions of each vessel. Bilateral testing is considered an integral part of a complete examination. Limited examinations for reoccurring indications may be performed as noted. The reflux portion of the exam is performed with the patient in reverse Trendelenburg.  +---------+---------------+---------+-----------+----------+--------------+  RIGHT     Compressibility Phasicity Spontaneity Properties Thrombus Aging  +---------+---------------+---------+-----------+----------+--------------+  CFV       Full            Yes       Yes                                     +---------+---------------+---------+-----------+----------+--------------+  SFJ  Full                                                             +---------+---------------+---------+-----------+----------+--------------+  FV Prox   Full                                                             +---------+---------------+---------+-----------+----------+--------------+  FV Mid    Full                                                             +---------+---------------+---------+-----------+----------+--------------+  FV Distal Full                                                             +---------+---------------+---------+-----------+----------+--------------+  PFV       Full                                                             +---------+---------------+---------+-----------+----------+--------------+  POP       Full            Yes       Yes                                    +---------+---------------+---------+-----------+----------+--------------+  PTV       Full                                                             +---------+---------------+---------+-----------+----------+--------------+  PERO      Full                                                             +---------+---------------+---------+-----------+----------+--------------+   +---------+---------------+---------+-----------+----------+--------------+  LEFT      Compressibility Phasicity Spontaneity Properties Thrombus Aging  +---------+---------------+---------+-----------+----------+--------------+  CFV       Full            Yes       Yes                                    +---------+---------------+---------+-----------+----------+--------------+  SFJ       Full                                                             +---------+---------------+---------+-----------+----------+--------------+  FV Prox   None                                                              +---------+---------------+---------+-----------+----------+--------------+  FV Mid    None                                                             +---------+---------------+---------+-----------+----------+--------------+  FV Distal None                                                             +---------+---------------+---------+-----------+----------+--------------+  PFV       Full                                                             +---------+---------------+---------+-----------+----------+--------------+  POP       None            Yes       Yes                                    +---------+---------------+---------+-----------+----------+--------------+  PTV       None                                                             +---------+---------------+---------+-----------+----------+--------------+  PERO      None                                                             +---------+---------------+---------+-----------+----------+--------------+     Summary: RIGHT: - There is no evidence of deep vein thrombosis in the lower extremity.  LEFT: - Findings consistent with acute deep vein thrombosis involving the left femoral vein, left popliteal vein, left posterior tibial veins, and left peroneal veins.  *See table(s) above for measurements and observations. Electronically signed by Harrell Gave  Scot Dock MD on 03/10/2020 at 2:36:17 PM.    Final    Scheduled Meds:  Chlorhexidine Gluconate Cloth  6 each Topical Daily   cholecalciferol  1,000 Units Oral Daily   darifenacin  7.5 mg Oral Daily   furosemide  40 mg Oral Daily   lisinopril  20 mg Oral Daily   mouth rinse  15 mL Mouth Rinse BID   memantine  10 mg Oral BID   mirabegron ER  50 mg Oral Daily   multivitamin with minerals  1 tablet Oral Daily   rivastigmine  9.5 mg Transdermal Daily   venlafaxine XR  75 mg Oral Q breakfast   Continuous Infusions:  heparin 1,500 Units/hr (03/11/20 0006)    LOS: 2 days   Kerney Elbe, DO [facility] PAGER is on [place]  If 7PM-7AM, please contact night-coverage [email or web address]

## 2020-03-12 ENCOUNTER — Inpatient Hospital Stay (HOSPITAL_COMMUNITY): Payer: Medicare HMO

## 2020-03-12 LAB — COMPREHENSIVE METABOLIC PANEL
ALT: 19 U/L (ref 0–44)
AST: 25 U/L (ref 15–41)
Albumin: 2.6 g/dL — ABNORMAL LOW (ref 3.5–5.0)
Alkaline Phosphatase: 51 U/L (ref 38–126)
Anion gap: 11 (ref 5–15)
BUN: 26 mg/dL — ABNORMAL HIGH (ref 8–23)
CO2: 27 mmol/L (ref 22–32)
Calcium: 8.5 mg/dL — ABNORMAL LOW (ref 8.9–10.3)
Chloride: 104 mmol/L (ref 98–111)
Creatinine, Ser: 1.04 mg/dL — ABNORMAL HIGH (ref 0.44–1.00)
GFR calc Af Amer: 57 mL/min — ABNORMAL LOW (ref >60)
GFR calc non Af Amer: 49 mL/min — ABNORMAL LOW (ref >60)
Glucose, Bld: 115 mg/dL — ABNORMAL HIGH (ref 70–99)
Potassium: 4.3 mmol/L (ref 3.5–5.1)
Sodium: 142 mmol/L (ref 135–145)
Total Bilirubin: 0.7 mg/dL (ref 0.3–1.2)
Total Protein: 6.3 g/dL — ABNORMAL LOW (ref 6.5–8.1)

## 2020-03-12 LAB — CBC WITH DIFFERENTIAL/PLATELET
Abs Immature Granulocytes: 0.04 10*3/uL (ref 0.00–0.07)
Basophils Absolute: 0 10*3/uL (ref 0.0–0.1)
Basophils Relative: 0 %
Eosinophils Absolute: 0.6 10*3/uL — ABNORMAL HIGH (ref 0.0–0.5)
Eosinophils Relative: 6 %
HCT: 38 % (ref 36.0–46.0)
Hemoglobin: 12.1 g/dL (ref 12.0–15.0)
Immature Granulocytes: 0 %
Lymphocytes Relative: 17 %
Lymphs Abs: 1.6 10*3/uL (ref 0.7–4.0)
MCH: 30.2 pg (ref 26.0–34.0)
MCHC: 31.8 g/dL (ref 30.0–36.0)
MCV: 94.8 fL (ref 80.0–100.0)
Monocytes Absolute: 1.1 10*3/uL — ABNORMAL HIGH (ref 0.1–1.0)
Monocytes Relative: 12 %
Neutro Abs: 5.8 10*3/uL (ref 1.7–7.7)
Neutrophils Relative %: 65 %
Platelets: 304 10*3/uL (ref 150–400)
RBC: 4.01 MIL/uL (ref 3.87–5.11)
RDW: 13.2 % (ref 11.5–15.5)
WBC: 9.2 10*3/uL (ref 4.0–10.5)
nRBC: 0 % (ref 0.0–0.2)

## 2020-03-12 LAB — HEPARIN LEVEL (UNFRACTIONATED): Heparin Unfractionated: 0.42 IU/mL (ref 0.30–0.70)

## 2020-03-12 LAB — PHOSPHORUS: Phosphorus: 4 mg/dL (ref 2.5–4.6)

## 2020-03-12 LAB — MAGNESIUM: Magnesium: 2.3 mg/dL (ref 1.7–2.4)

## 2020-03-12 MED ORDER — FUROSEMIDE 10 MG/ML IJ SOLN
40.0000 mg | Freq: Once | INTRAMUSCULAR | Status: AC
  Administered 2020-03-12: 40 mg via INTRAVENOUS
  Filled 2020-03-12: qty 4

## 2020-03-12 MED ORDER — GUAIFENESIN ER 600 MG PO TB12
1200.0000 mg | ORAL_TABLET | Freq: Two times a day (BID) | ORAL | Status: DC
  Administered 2020-03-12 – 2020-03-14 (×5): 1200 mg via ORAL
  Filled 2020-03-12 (×5): qty 2

## 2020-03-12 NOTE — Evaluation (Signed)
Physical Therapy Evaluation Patient Details Name: Brianna Pineda MRN: [id number] DOB: 1932-02-22 Today's Date: 03/12/2020   History of Present Illness  Patient admitted from home with increased weakness, unable to get out of bed and walk, fatigue. O2 saturations were 62% at admission. PMH includes: dementia  Clinical Impression  Patient received in bed upon arrival. Agrees to PT session. Patient reports she is weak and unsure about mobility. She requires min assist with bed mobility, assist to raise trunk fully to seated position and to scoot forward to edge of bed.  Patient requires mod +2 assist to perform sit to stand from elevated surface with cues for upright position. She was able to transfer to recliner taking a few steps with RW and min/mod assist +2 for safety. Patient will continue to benefit from skilled PT while here to improve strength and functional mobility to return home with family at discharge.         Follow Up Recommendations Home health PT;Supervision for mobility/OOB    Equipment Recommendations  None recommended by PT    Recommendations for Other Services       Precautions / Restrictions Precautions Precautions: Fall Restrictions Weight Bearing Restrictions: No      Mobility  Bed Mobility Overal bed mobility: Needs Assistance Bed Mobility: Supine to Sit     Supine to sit: Min assist     General bed mobility comments: min assist to scoot forward to the edge of the bed and min assist to raise trunk to full seated position  Transfers Overall transfer level: Needs assistance Equipment used: Rolling walker (2 wheeled) Transfers: Sit to/from Stand;Stand Pivot Transfers Sit to Stand: +2 safety/equipment;From elevated surface;Mod assist Stand pivot transfers: +2 safety/equipment;Min assist          Ambulation/Gait Ambulation/Gait assistance: Min assist Gait Distance (Feet): 3 Feet Assistive device: Rolling walker (2 wheeled) Gait Pattern/deviations:  Step-to pattern;Trunk flexed;Decreased step length - right;Decreased step length - left;Shuffle Gait velocity: decr   General Gait Details: able to take a few steps from bed to recliner, however fatigued with this. Prior to transfer patient required 2 sit to stand transfers to get bottom cleaned up.  Stairs            Wheelchair Mobility    Modified Rankin (Stroke Patients Only)       Balance Overall balance assessment: Needs assistance Sitting-balance support: Feet supported Sitting balance-Leahy Scale: Fair     Standing balance support: Bilateral upper extremity supported;During functional activity Standing balance-Leahy Scale: Poor Standing balance comment: reliant on RW and external support for safety                             Pertinent Vitals/Pain Pain Assessment: No/denies pain    Home Living Family/patient expects to be discharged to:: Private residence Living Arrangements: Spouse/significant other Available Help at Discharge: Family;Available 24 hours/day Type of Home: House Home Access: Level entry     Home Layout: One level Home Equipment: Walker - 4 wheels;Cane - single point;Bedside commode;Shower seat - built in;Grab bars - tub/shower;Hand held shower head Additional Comments: they have ordered a wheelchair, but have not gotten it yet per husband    Prior Function Level of [place]: Needs assistance   Gait / Transfers Assistance Needed: walked with walker  ADL's / Homemaking Assistance Needed: assisted for IADL and for thoroughness with sponge bathing, otherwise independent        Hand Dominance   Dominant  Hand: Right    Extremity/Trunk Assessment   Upper Extremity Assessment Upper Extremity Assessment: Defer to OT evaluation;Generalized weakness    Lower Extremity Assessment Lower Extremity Assessment: Generalized weakness    Cervical / Trunk Assessment Cervical / Trunk Assessment: Normal  Communication    Communication: HOH  Cognition Arousal/Alertness: Awake/alert Behavior During Therapy: WFL for tasks assessed/performed Overall Cognitive Status: History of cognitive impairments - at baseline                                 General Comments: oriented to person, place, month, not situation      General Comments      Exercises     Assessment/Plan    PT Assessment Patient needs continued PT services  PT Problem List Decreased strength;Decreased mobility;Decreased activity tolerance;Decreased balance;Obesity;Decreased safety awareness       PT Treatment Interventions DME instruction;Therapeutic exercise;Gait training;Balance training;Functional mobility training;Therapeutic activities;Patient/family education    PT Goals (Current goals can be found in the Care Plan section)  Acute Rehab PT Goals Patient Stated Goal: to return home PT Goal Formulation: With patient/family Time For Goal Achievement: 03/26/20 Potential to Achieve Goals: Good    Frequency Min 3X/week   Barriers to discharge        Co-evaluation               AM-PAC PT "6 Clicks" Mobility  Outcome Measure Help needed turning from your back to your side while in a flat bed without using bedrails?: A Little Help needed moving from lying on your back to sitting on the side of a flat bed without using bedrails?: A Little Help needed moving to and from a bed to a chair (including a wheelchair)?: A Lot Help needed standing up from a chair using your arms (e.g., wheelchair or bedside chair)?: A Lot Help needed to walk in hospital room?: A Lot Help needed climbing 3-5 steps with a railing? : Total 6 Click Score: 13    End of Session Equipment Utilized During Treatment: Gait belt Activity Tolerance: Patient tolerated treatment well;Patient limited by fatigue Patient left: in chair;with call bell/phone within reach;with bed alarm set;with family/visitor present Nurse Communication: Mobility  status PT Visit Diagnosis: Muscle weakness (generalized) (M62.81);Difficulty in walking, not elsewhere classified (R26.2)    Time: [id number] PT Time Calculation (min) (ACUTE ONLY): 34 min   Charges:   PT Evaluation $PT Eval Moderate Complexity: 1 Mod PT Treatments $Therapeutic Activity: 8-22 mins        Collyn Selk, PT, GCS 03/12/20,12:35 PM

## 2020-03-12 NOTE — Progress Notes (Signed)
Pt turned down to 4l by PT. Sustains but just works harder breathing with activity. Turned down to 3L now.

## 2020-03-12 NOTE — Plan of Care (Signed)

## 2020-03-12 NOTE — TOC Initial Note (Signed)
Transition of Care [facility]) - Initial/Assessment Note    Patient Details  Name: Brianna Pineda MRN: [id number] Date of Birth: 1932-02-18  [facility] of [facility] CM/SW Contact:    Angelita Ingles, RN Phone Number: 03/12/2020, 4:03 PM  Clinical Narrative:                 CM at bedside met with patient and husband. Patient states that she lives on the lower level of sons home and that son pays an active role in helping to provide care. Currently has walker &cane at home and that wheelchair has been ordered through [place]. Husband states that he has previously spoken with primary care provider about receiving home health to help with the care of his wife. Will continue to follow for any home needs.   Expected Discharge Plan: Home/Self Care(with husband) Barriers to Discharge: Continued Medical Work up   Patient Goals and CMS Choice        Expected Discharge Plan and Services Expected Discharge Plan: Home/Self Care(with husband)   Discharge Planning Services: CM Consult   Living arrangements for the past 2 months: Single Family Home(lived in lower level of sons home)                                      Prior Living Arrangements/Services Living arrangements for the past 2 months: Single Family Home(lived in lower level of sons home) Lives with:: Spouse Patient language and need for interpreter reviewed:: Yes Do you feel safe going back to the place where you live?: Yes      Need for Family Participation in Patient Care: Yes (Comment) Care giver support system in place?: Yes (comment)   Criminal Activity/Legal Involvement Pertinent to Current Situation/Hospitalization: No - Comment as needed  Activities of Daily Living      Permission Sought/Granted Permission sought to share information with : Family Supports(with husband) Permission granted to share information with : Yes, Verbal Permission Granted  Share Information with NAME: Jeneen Rinks  Santarelli     Permission granted to share info w Relationship: husband     Emotional Assessment Appearance:: Appears stated age Attitude/Demeanor/Rapport: Gracious Affect (typically observed): Calm Orientation: : Oriented to Self, Oriented to Place, Oriented to  Time, Oriented to Situation Alcohol / Substance Use: Not Applicable Psych Involvement: No (comment)  Admission diagnosis:  Acute pulmonary embolism ([place]) [I26.99] Patient Active Problem List   Diagnosis Date Noted  . Acute pulmonary embolism ([place]) 03/09/2020  . S/P total knee arthroplasty 01/21/2016   PCP:  Nicoletta Dress, MD Pharmacy:   [place], [place] - [place] [address] Phone: [phone] Fax: [phone]     Social Determinants of Health (SDOH) Interventions    Readmission Risk Interventions No flowsheet data found.

## 2020-03-12 NOTE — Progress Notes (Signed)
Pt had a better night than previous shift. Daughter n law at bedside per shift.

## 2020-03-12 NOTE — Progress Notes (Addendum)
  Speech Language Pathology Treatment: Dysphagia  Patient Details Name: Brianna Pineda MRN: [id number] DOB: 25-Sep-1932 Today's Date: 03/12/2020 Time: [id number] SLP Time Calculation (min) (ACUTE ONLY): 17 min  Assessment / Plan / Recommendation Clinical Impression  BSE order received and acknowledged.  BSE was completed yesterday and pt is already on caseload, therefore a diagnostic treatment session was performed in place of repeat BSE.  Per RN and order notes, pt had suspected aspiration while drinking her coffee this morning evidenced via coughing.  Husband reported that pt had poor positioning in bed when this occurred and that he had not observed any additional coughing or choking episodes during her breakfast consumption.  Husband additionally reported that pt did not have frequent coughing or choking with PO intake at baseline.   Pt was sitting upright in a chair upon SLP arrival.  She consumed trials of thin liquid (4oz), nectar-thick liquid, and regular solids.  Audible swallows were intermittently observed and RR appeared to increase slightly during PO intake.  Some expiratory wheezing was also noted.  This was observed with thin liquid and nectar-thick liquid, with and without the straw.  Mastication of regular solids was timely and no oral residue was observed.  Pt was noted to be mildly impulsive as she attempted to verbalize during mastication.  No coughing or choking was noted with any trials despite challenging.  Recommend continuation of regular solids and thin liquids with strict adherence to the following compensatory strategies:1) Small bites/sips 2) Slow rate of intake 3) Sit upright as close to 90 degrees as possible 4) Take a break from PO intake if SOB.  SLP educated pt and husband regarding all recommendations and a sign was hung above the pt's bed.  SLP will briefly f/u to monitor diet tolerance.     HPI HPI: Pt is an 86 yo female with hx of dementia transferred from [place] with  acute PE with RV strain.      SLP Plan  Continue with current plan of care       Recommendations  Diet recommendations: Regular;Thin liquid Liquids provided via: Cup;Straw Medication Administration: Whole meds with liquid Supervision: Patient able to self feed Compensations: Small sips/bites;Slow rate;Minimize environmental distractions Postural Changes and/or Swallow Maneuvers: Out of bed for meals;Seated upright 90 degrees                Oral Care Recommendations: Oral care BID;Staff/trained caregiver to provide oral care;Patient independent with oral care Follow up Recommendations: None SLP Visit Diagnosis: Dysphagia, unspecified (R13.10) Plan: Continue with current plan of care       GO               Colin Mulders M.S., [place] Office: [phone]  [place] 03/12/2020, 11:26 AM

## 2020-03-12 NOTE — Progress Notes (Signed)
[place] for Heparin  Indication: pulmonary embolus, LLE DVT  Allergies  Allergen Reactions  . Other     NO BLOOD OR BLOOD PRODUCTS - Jehovah's witness    Patient Measurements: Wt = 110kg Ht = 5' 6''  IBW = 59 kg Heparin dosing wt: 85kg  Vital Signs: Temp: 98.1 F (36.7 C) (03/29 0812) Temp Source: Oral (03/28 2343) BP: 152/101 (03/29 0812) Pulse Rate: 88 (03/29 0812)  Labs: Recent Labs    03/10/20 0332 03/10/20 0332 03/10/20 1140 03/11/20 0215 03/11/20 0834 03/11/20 1753 03/11/20 1938 03/12/20 0247  HGB 12.3   < >  --  12.7  --   --   --  12.1  HCT 38.0  --   --  39.0  --   --   --  38.0  PLT 269  --   --  302  --   --   --  304  HEPARINUNFRC 0.44   < > 0.45 0.37  --   --   --  0.42  CREATININE 1.50*  --   --   --  1.12*  --   --  1.04*  TROPONINIHS  --   --   --   --   --  59* 58*  --    < > = values in this interval not displayed.    Estimated Creatinine Clearance: 49.4 mL/min (A) (by C-G formula based on SCr of 1.04 mg/dL (H)).   Assessment: 50 YOF with SOB and CT [facility]at [facility]) showed saddle PE. Dopplers also +LLE DVT.  Pharmacy consulted to dose heparin.  Heparin level is therapeutic; no bleeding reporetd.  Goal of Therapy:  Heparin level 0.3-0.7 units/ml Monitor platelets by anticoagulation protocol: Yes   Plan:  Continue heparin gtt at 1500 units/hr Daily heparin level and CBC Follow-up plans for oral anticoagulation  Charlii Yost D. Mina Marble, PharmD, BCPS, [place] 03/12/2020, 8:47 AM

## 2020-03-12 NOTE — Progress Notes (Signed)
PROGRESS NOTE    Brianna Pineda  [id number] DOB: 02/03/1932 DOA: 03/09/2020 PCP: Nicoletta Dress, MD   Brief Narrative:  HPI per Dr. Chesley Mires on 03/09/20 86 yo female seen by PCP for several days of dyspnea and fatigue.  She has baseline history of dementia and reportedly requires total care.  In ER at [facility] noted to have SpO2 88%.  ABG showed PaO2 62 on room air.  Creatinine 1.60, lactic acid 0.9, Troponin I 0.42, Hb 12.9, PLT 250.  CXR reports 2.3 x 2.2 cm opacity in Lt base.  CT angio chest showed b/l saddle PE with RV:LV ratio 1.23 and Lt basilar ATX with small effusion.  **Interim History  Was placed on a heparin drip 3 Days ago and  The night before last she became delirious and impulsive and required a telemetry sitter. She had a better night last nigt.  She remains on oxygen 5-6 L currently.  She is also found to have an extensive left leg DVT.  Her renal function is improving and mentation is improved as well from her husband.  She was witnessed to cough on her coffee however she was not properly seated with aspiration precautions.  SLP reevaluated and recommending regular diet with thin liquids still.  We will attempt to wean her off of oxygen and likely transition from a heparin drip to a NOAC in a.m. obtain PT OT evaluation recommending home health.  Assessment & Plan:   Active Problems:   Acute pulmonary embolism (HCC)  Acute Hypoxemic Respiratory Failure secondary to Acute submassive pulmonary embolism with complications of an Acute Left Leg DVT -Admitted to PCCM and now transferred to [facility] on 03/11/20 -Per PCCM the patient's age precludes half dose systemic thrombolytic therapy (risks vs benefits discussed with patient's husband) and they are not going forward with Systemic Thrombolytic Therapy and it was deferred on admission -Will consider IVC filter at some point given that if she is unable to tolerate heparin and has potential bleeding complications related to fall  risk  -PESI Class at least a 3 -Will continue Heparin gtt for at least 3 Days today is day 3 prior to transition to NOAC -Check Troponin Levels with elevated at 59 and trended down to 48 -Lower Extremity Dopplers done and showed "Findings consistent with acute deep vein thrombosis involving the left femoral vein, left popliteal vein, left posterior tibial veins, and left  peroneal veins." -Continuous pulse oximetry and maintain O2 saturations greater than 90% -SpO2: 99 % O2 Flow Rate (L/min): 6 L/min; wean O2 as tolerated and asked the nurse to wean down -Continue supplemental oxygen via nasal cannula and wean O2 as tolerated -ECHOCardiogram showed "Left ventricular ejection fraction, by estimation, is 55 to 60%. The left ventricle has normal function. The left ventricle has no regional wall motion abnormalities. The left ventricular internal cavity size was normal in size. There is no left ventricular hypertrophy. Left ventricular diastolic parameters are consistent with Grade I diastolic dysfunction (impaired relaxation). Right Ventricle: The right ventricular size is mildly enlarged. Right vetricular wall thickness was not assessed. Right ventricular systolic function is mildly reduced. There is moderately elevated pulmonary artery systolic pressure. The tricuspid regurgitant velocity is 3.18 m/s, and with an assumed right atrial pressure of 3 mmHg, the estimated right ventricular systolic pressure is [id number] mmHg." -Will need an Ambulatory Home O2 Screen prior to D/C I have ordered one for tomorrow -Repeat CXR this AM showed "Low volume chest with mild atelectatic type density at  the bases. Cardiomegaly without edema."  AKI on questionable stage III CKD Hyperphosphatemia  -Patient's BUN/creatinine went from 30/1.50 -> 33/1.12 -> 26/1.04 -Phos level was 5.0 now is 4.0 -Continue with Lasix 40 mg p.o. daily; she received a dose of IV Lasix 40 mg this a.m. given that speech was to reevaluate and we  held her p.o. -Avoid nephrotoxic medications, contrast dyes, hypotension and renally adjust medications -Repeat CMP in the a.m.  Acute on Chronic Diastolic CHF -Echo showed grade 1 diastolic dysfunction -BNP on admission was 935.9 -Chest x-ray this AM showed "Low volume chest with mild atelectatic type density at the bases. Cardiomegaly without edema." -Continue with Lasix 40 mg po Daily; received a dose of IV Lasix this morning given the concern for aspiration -Patient is -2.6073 Liters since admission; weight is down 3 pounds since admission was not checked the last 3 days -Continue to Monitor Volume Status carefully  History of OSA -Continue CPAP nightly  History of Dementia with Behavioral Disturbances Chronic pain -Obtained SLP and recommending Regular Diet with Thin Liquids even after a few days witnessed coughing after drinking her coffee -Continue with memantine 10 mg p.o. twice daily, rivastigmine 9.5 mg transdermal patch, and venlafaxine XR 75 mg p.o. daily -Continue with methocarbamol [phone] mg p.o. every 6 as needed muscle spasm -Continued safety sitter one-to-one -Given Haldol 0.5 mg IV once the day before yesterday -Seems to be improved today  HTN -Continue with lisinopril 20 mg p.o. daily and with hydralazine 10 mg IV every 4 as needed for systolic blood pressure again 160 as well as the metoprolol tartrate tartrate 5 mg IV every 4 hours as needed for systolic blood pressure when 160 or heart rate greater than 110.   J[employer] - accepts no blood products  Leukocytosis, improving -Mild at 10.8 and improved to 9.2 -In the setting of Acute PE  -Continue to monitor for signs and symptoms of infection; currently she is afebrile -Repeat CBC in the a.m.  Goals of care, DNR  -Patient's Daughter-In-Law Bedside as well as patient's husband -She has progressive dementia and has become more sedentary with increasing care needs -She is DNR/DNI -PCCM discussed  risk/benefit ratio of thrombolytic therapy with husband >> he is in agreement to defer this unless clinical status gets worse  Obesity -Estimated body mass index is 38.68 kg/m as calculated from the following:   Height as of this encounter: 5\' 6"  (1.676 m).   Weight as of this encounter: 108.7 kg. -Weight Loss and Dietary Counseling given   Hyperglycemia -Patient blood sugar has been elevated on daily BMP/CMP in range from 115-132 -No hemoglobin A1c on file -Continue monitor blood sugars carefully and if necessary will place on sensitive NovoLog sliding scale insulin AC  DVT prophylaxis: Anticoagulated with a heparin drip Code Status: DO NOT RESUSCITATE  Family Communication: Discussed with the patient's daughter-in-law and husband at bedside Disposition Plan: Will need PT/OT to Evaluate and Treat and be transitioned to po Anticoagulation and she is now recommended for home health PT and OT.  Anticipating transitioning to p.o. NOAC in the a.m. and will need to do a home Ambulatory screen prior to discharge.; Will continue to Monitor for S/Sx of bleeding she is anticoagulated with heparin drip  Consultants:   PCCM Transfer  Procedures:  ECHOCARDIOGRAM IMPRESSIONS    1. Left ventricular ejection fraction, by estimation, is 55 to 60%. The  left ventricle has normal function. The left ventricle has no regional  wall motion abnormalities. Left  ventricular diastolic parameters are  consistent with Grade I diastolic  dysfunction (impaired relaxation).  2. Right ventricular systolic function is mildly reduced. The right  ventricular size is mildly enlarged. There is moderately elevated  pulmonary artery systolic pressure. The estimated right ventricular  systolic pressure is [id number] mmHg.  3. The mitral valve is normal in structure. No evidence of mitral valve  regurgitation. No evidence of mitral stenosis.  4. The aortic valve is normal in structure. Aortic valve regurgitation is   not visualized. No aortic stenosis is present.   FINDINGS  Left Ventricle: Left ventricular ejection fraction, by estimation, is 55  to 60%. The left ventricle has normal function. The left ventricle has no  regional wall motion abnormalities. The left ventricular internal cavity  size was normal in size. There is  no left ventricular hypertrophy. Left ventricular diastolic parameters  are consistent with Grade I diastolic dysfunction (impaired relaxation).   Right Ventricle: The right ventricular size is mildly enlarged. Right  vetricular wall thickness was not assessed. Right ventricular systolic  function is mildly reduced. There is moderately elevated pulmonary artery  systolic pressure. The tricuspid  regurgitant velocity is 3.18 m/s, and with an assumed right atrial  pressure of 3 mmHg, the estimated right ventricular systolic pressure is  [id number] mmHg.   Left Atrium: Left atrial size was normal in size.   Right Atrium: Right atrial size was normal in size.   Pericardium: There is no evidence of pericardial effusion.   Mitral Valve: The mitral valve is normal in structure. No evidence of  mitral valve regurgitation. No evidence of mitral valve stenosis.   Tricuspid Valve: The tricuspid valve is normal in structure. Tricuspid  valve regurgitation is trivial. No evidence of tricuspid stenosis.   Aortic Valve: The aortic valve is normal in structure. Aortic valve  regurgitation is not visualized. No aortic stenosis is present.   Pulmonic Valve: The pulmonic valve was grossly normal. Pulmonic valve  regurgitation is not visualized. No evidence of pulmonic stenosis.   Aorta: The aortic root and ascending aorta are structurally normal, with  no evidence of dilitation.   IAS/Shunts: The atrial septum is grossly normal.     LEFT VENTRICLE  PLAX 2D  LVIDd:     3.80 cm Diastology  LVIDs:     2.20 cm LV e' lateral:  9.03 cm/s  LV PW:     1.10 cm LV E/e'  lateral: 8.2  LV IVS:    1.00 cm LV e' medial:  7.18 cm/s  LVOT diam:   2.00 cm LV E/e' medial: 10.3  LV SV:     61  LV SV Index:  28  LVOT Area:   3.14 cm     RIGHT VENTRICLE  RV S prime:   18.80 cm/s   LEFT ATRIUM       Index    RIGHT ATRIUM      Index  LA diam:    3.80 cm 1.76 cm/m RA Area:   15.30 cm  LA Vol (A2C):  45.2 ml 20.93 ml/m RA Volume:  34.90 ml 16.16 ml/m  LA Vol (A4C):  38.1 ml 17.64 ml/m  LA Biplane Vol: 41.6 ml 19.26 ml/m  AORTIC VALVE  LVOT Vmax:  118.00 cm/s  LVOT Vmean: 65.900 cm/s  LVOT VTI:  0.194 m    AORTA  Ao Root diam: 2.80 cm  Ao Asc diam: 3.10 cm   MITRAL VALVE        TRICUSPID VALVE  MV Area (PHT): 3.48 cm   TR Peak grad:  40.4 mmHg  MV Decel Time: 218 msec   TR Vmax:    318.00 cm/s  MV E velocity: 74.10 cm/s  MV A velocity: 104.00 cm/s SHUNTS  MV E/A ratio: 0.71     Systemic VTI: 0.19 m               Systemic Diam: 2.00 cm   LE VENOUS DUPLEX RIGHT:  - There is no evidence of deep vein thrombosis in the lower extremity.    LEFT:  - Findings consistent with acute deep vein thrombosis involving the left  femoral vein, left popliteal vein, left posterior tibial veins, and left  peroneal veins.    Antimicrobials:  Anti-infectives (From admission, onward)   None     Subjective: Seen And examined at bedside and she is more awake and alert today and answers questions appropriately.  Slightly confused still but was much better than yesterday.  Had a witnessed coughing after drinking her coffee multiple times.  No chest pain, lightheadedness or dizziness.  No other concerns or plans at this time and will attempt to wean her oxygen.   Objective: Vitals:   03/12/20 0528 03/12/20 0812 03/12/20 1215 03/12/20 1223  BP:  (!) 152/101 (!) 116/99   Pulse:  88 82   Resp: (!) 22 19    Temp:  98.1 F (36.7 C)    TempSrc:      SpO2:  99%  99%    Weight:      Height:        Intake/Output Summary (Last 24 hours) at 03/12/2020 1415 Last data filed at 03/12/2020 0700 Gross per 24 hour  Intake 629.21 ml  Output 1350 ml  Net -720.79 ml   Filed Weights   03/09/20 1545 03/10/20 0500  Weight: 110.1 kg 108.7 kg   Examination: Physical Exam:  Constitutional: WN/WD obese Caucasian female currently in slight respiratory distress appears more comfortable than yesterday and more awake, NAD and appears calm and comfortable Eyes: Lids and conjunctivae normal, sclerae anicteric  ENMT: External Ears, Nose appear normal. Grossly normal hearing. Neck: Appears normal, supple, no cervical masses, normal ROM, no appreciable thyromegaly; no JVD Respiratory: Diminished to auscultation bilaterally with coarse breath sounds, no wheezing, rales, rhonchi or crackles.  Mildly increased respiratory effort and she is wearing 6 L of supplemental oxygen via nasal cannula Cardiovascular: RRR, no murmurs / rubs / gallops. S1 and S2 auscultated.  1+ extremity edema worse on the left compared to right. 2+ pedal pulses.  Abdomen: Soft, non-tender, distended secondary body habitus. Bowel sounds positive.  GU: Deferred. Musculoskeletal: No clubbing / cyanosis of digits/nails. No joint deformity upper and lower extremities Skin: No rashes, lesions, ulcers on a limited skin evaluation. No induration; Warm and dry.  Neurologic: CN 2-12 grossly intact with no focal deficits. Romberg sig andn cerebellar reflexes not assessed.  Psychiatric: Normal judgment and insight. Alert and oriented x 3. Normal mood and appropriate affect.   Data Reviewed: I have personally reviewed following labs and imaging studies  CBC: Recent Labs  Lab 03/10/20 0332 03/11/20 0215 03/12/20 0247  WBC 10.8* 10.1 9.2  NEUTROABS  --   --  5.8  HGB 12.3 12.7 12.1  HCT 38.0 39.0 38.0  MCV 93.1 92.6 94.8  PLT 269 302 [id number]   Basic Metabolic Panel: Recent Labs  Lab 03/10/20 0332  03/11/20 0834 03/12/20 0247  NA 139 141 142  K 4.1 4.4 4.3  CL 102 104 104  CO2 24 25 27   GLUCOSE 104* 132* 115*  BUN 30* 33* 26*  CREATININE 1.50* 1.12* 1.04*  CALCIUM 9.0 8.9 8.5*  MG  --  2.5* 2.3  PHOS  --  5.0* 4.0   GFR: Estimated Creatinine Clearance: 49.4 mL/min (A) (by C-G formula based on SCr of 1.04 mg/dL (H)). Liver Function Tests: Recent Labs  Lab 03/10/20 0332 03/11/20 0834 03/12/20 0247  AST 18 26 25   ALT 15 20 19   ALKPHOS 55 50 51  BILITOT 1.1 0.9 0.7  PROT 6.9 6.8 6.3*  ALBUMIN 3.1* 2.8* 2.6*   No results for input(s): LIPASE, AMYLASE in the last 168 hours. No results for input(s): AMMONIA in the last 168 hours. Coagulation Profile: No results for input(s): INR, PROTIME in the last 168 hours. Cardiac Enzymes: No results for input(s): CKTOTAL, CKMB, CKMBINDEX, TROPONINI in the last 168 hours. BNP (last 3 results) No results for input(s): PROBNP in the last 8760 hours. HbA1C: No results for input(s): HGBA1C in the last 72 hours. CBG: Recent Labs  Lab 03/09/20 1935 03/09/20 2337 03/10/20 0340 03/10/20 0720 03/10/20 1112  GLUCAP 110* 107* 98 123* 114*   Lipid Profile: No results for input(s): CHOL, HDL, LDLCALC, TRIG, CHOLHDL, LDLDIRECT in the last 72 hours. Thyroid Function Tests: No results for input(s): TSH, T4TOTAL, FREET4, T3FREE, THYROIDAB in the last 72 hours. Anemia Panel: No results for input(s): VITAMINB12, FOLATE, FERRITIN, TIBC, IRON, RETICCTPCT in the last 72 hours. Sepsis Labs: No results for input(s): PROCALCITON, LATICACIDVEN in the last 168 hours.  Recent Results (from the past 240 hour(s))  MRSA PCR Screening     Status: None   Collection Time: 03/09/20  3:40 PM   Specimen: Nasopharyngeal  Result Value Ref Range Status   MRSA by PCR NEGATIVE NEGATIVE Final    Comment:        The GeneXpert MRSA Assay (FDA approved for NASAL specimens only), is one component of a comprehensive MRSA colonization surveillance program. It  is not intended to diagnose MRSA infection nor to guide or monitor treatment for MRSA infections. Performed at [facility], [place] [address], [place],  [address]     RN Pressure Injury Documentation:     Estimated body mass index is 38.68 kg/m as calculated from the following:   Height as of this encounter: 5\' 6"  (1.676 m).   Weight as of this encounter: 108.7 kg.  Malnutrition Type:      Malnutrition Characteristics:      Nutrition Interventions:    Radiology Studies: DG CHEST PORT 1 VIEW  Result Date: 03/12/2020 CLINICAL DATA:  Shortness of breath EXAM: PORTABLE CHEST 1 VIEW COMPARISON:  Three days ago FINDINGS: Cardiomegaly with mild streaky density at the bases. Lung volumes are low. No effusion or pneumothorax. IMPRESSION: 1. Low volume chest with mild atelectatic type density at the bases. 2. Cardiomegaly without edema. Electronically Signed   By: Monte Fantasia M.D.   On: 03/12/2020 07:17   Scheduled Meds: . Chlorhexidine Gluconate Cloth  6 each Topical Daily  . cholecalciferol  1,000 Units Oral Daily  . darifenacin  7.5 mg Oral Daily  . furosemide  40 mg Oral Daily  . guaiFENesin  1,200 mg Oral BID  . lisinopril  20 mg Oral Daily  . mouth rinse  15 mL Mouth Rinse BID  . memantine  10 mg Oral BID  . mirabegron ER  50 mg Oral Daily  . multivitamin with minerals  1 tablet Oral Daily  . rivastigmine  9.5 mg Transdermal Daily  . venlafaxine XR  75 mg Oral Q breakfast   Continuous Infusions: . heparin 1,500 Units/hr (03/12/20 1235)    LOS: 3 days   Kerney Elbe, DO [facility] PAGER is on [place]  If 7PM-7AM, please contact night-coverage [email or web address]

## 2020-03-12 NOTE — Evaluation (Signed)
Occupational Therapy Evaluation Patient Details Name: Brianna Pineda MRN: [id number] DOB: 21-Oct-1932 Today's Date: 03/12/2020    History of Present Illness Patient admitted from home with increased weakness, unable to get out of bed and walk, fatigue. O2 saturations were 62% at admission. PMH includes: dementia   Clinical Impression   Pt was ambulating with a walker and assisted for sponge bathing and IADL at her baseline. Pt presents with generalized weakness, decreased standing balance and cognitive deficits (likely baseline). Pt currently requires +2 min to mod assist for OOB and set up to total assist for ADL. Pt's husband present for session and providing home set up and PLOF information as well as observing mobility. Pt pleased to be OOB in chair. Per husband, a w/c has been ordered for pt prior to admission, but not yet received. Recommend home with [facility] with w/c. Will follow acutely.    Follow Up Recommendations  Home health OT;Supervision/Assistance - 24 hour    Equipment Recommendations  Wheelchair (measurements OT);Wheelchair cushion (measurements OT)    Recommendations for Other Services       Precautions / Restrictions Precautions Precautions: Fall Restrictions Weight Bearing Restrictions: No      Mobility Bed Mobility Overal bed mobility: Needs Assistance Bed Mobility: Supine to Sit     Supine to sit: Min assist     General bed mobility comments: min assist to scoot forward to the edge of the bed and min assist to raise trunk to full seated position  Transfers Overall transfer level: Needs assistance Equipment used: Rolling walker (2 wheeled) Transfers: Sit to/from [employer] Transfers Sit to Stand: +2 safety/equipment;From elevated surface;Mod assist Stand pivot transfers: +2 safety/equipment;Min assist       General transfer comment: cues for hand placement    Balance Overall balance assessment: Needs assistance Sitting-balance support: Feet  supported Sitting balance-Leahy Scale: Fair     Standing balance support: Bilateral upper extremity supported;During functional activity Standing balance-Leahy Scale: Poor Standing balance comment: reliant on RW and external support for safety                           ADL either performed or assessed with clinical judgement   ADL Overall ADL's : Needs assistance/impaired Eating/Feeding: Set up;Sitting   Grooming: Brushing hair;Sitting;Set up   Upper Body Bathing: Minimal assistance;Sitting   Lower Body Bathing: Total assistance;+2 for physical assistance;Sit to/from stand   Upper Body Dressing : Minimal assistance;Sitting   Lower Body Dressing: Total assistance;Sit to/from stand;+2 for physical assistance   Toilet Transfer: +2 for physical assistance;Minimal assistance;Stand-pivot;RW   Toileting- Clothing Manipulation and Hygiene: +2 for physical assistance;Total assistance;Sit to/from stand Toileting - Clothing Manipulation Details (indicate cue type and reason): pt with bowel incontinence             Vision Baseline Vision/History: Wears glasses Wears Glasses: At all times Patient Visual Report: No change from baseline       Perception     Praxis      Pertinent Vitals/Pain Pain Assessment: No/denies pain     Hand Dominance Right   Extremity/Trunk Assessment Upper Extremity Assessment Upper Extremity Assessment: Generalized weakness   Lower Extremity Assessment Lower Extremity Assessment: Defer to PT evaluation   Cervical / Trunk Assessment Cervical / Trunk Assessment: Normal   Communication Communication Communication: HOH   Cognition Arousal/Alertness: Awake/alert Behavior During Therapy: WFL for tasks assessed/performed Overall Cognitive Status: History of cognitive impairments - at baseline  General Comments: oriented to person, place, month, not situation   General Comments        Exercises     Shoulder Instructions      Home Living Family/patient expects to be discharged to:: Private residence Living Arrangements: Spouse/significant other Available Help at Discharge: Family;Available 24 hours/day Type of Home: House Home Access: Level entry     Home Layout: One level     Bathroom Shower/Tub: Occupational psychologist: Handicapped height     Home Equipment: Environmental consultant - 4 wheels;Cane - single point;Bedside commode;Shower seat - built in;Grab bars - tub/shower;Hand held shower head   Additional Comments: they have ordered a wheelchair, but have not gotten it yet per husband      Prior Functioning/Environment Level of Independence: Needs assistance  Gait / Transfers Assistance Needed: walked with walker ADL's / Homemaking Assistance Needed: assisted for IADL and for thoroughness with sponge bathing, otherwise independent            OT Problem List: Decreased strength;Decreased activity tolerance;Impaired balance (sitting and/or standing);Decreased cognition;Decreased safety awareness;Decreased knowledge of use of DME or AE;Obesity      OT Treatment/Interventions: Self-care/ADL training;DME and/or AE instruction;Balance training;Patient/family education;Therapeutic activities    OT Goals(Current goals can be found in the care plan section) Acute Rehab OT Goals Patient Stated Goal: to return home OT Goal Formulation: With patient Time For Goal Achievement: 03/19/20 Potential to Achieve Goals: Good ADL Goals Pt Will Perform Grooming: sitting;with set-up Pt Will Perform Upper Body Dressing: with set-up;with supervision;sitting Pt Will Perform Lower Body Dressing: with mod assist;sit to/from stand Pt Will Transfer to Toilet: with min guard assist;ambulating;bedside commode Pt Will Perform Toileting - Clothing Manipulation and hygiene: with min assist;sit to/from stand Additional ADL Goal #1: Pt will perform bed mobility independently in  preparation for ADL.  OT Frequency: Min 2X/week   Barriers to D/C:            Co-evaluation PT/OT/SLP Co-Evaluation/Treatment: Yes Reason for Co-Treatment: For patient/therapist safety PT goals addressed during session: Mobility/safety with mobility;Balance;Strengthening/ROM;Proper use of DME OT goals addressed during session: ADL's and self-care;Proper use of Adaptive equipment and DME      AM-PAC OT "6 Clicks" Daily Activity     Outcome Measure Help from another person eating meals?: A Little Help from another person taking care of personal grooming?: A Little Help from another person toileting, which includes using toliet, bedpan, or urinal?: Total Help from another person bathing (including washing, rinsing, drying)?: A Lot Help from another person to put on and taking off regular upper body clothing?: A Lot Help from another person to put on and taking off regular lower body clothing?: Total 6 Click Score: 12   End of Session Equipment Utilized During Treatment: Rolling walker;Gait belt Nurse Communication: Mobility status  Activity Tolerance: Patient tolerated treatment well Patient left: in chair;with call bell/phone within reach;with chair alarm set;with family/visitor present;with nursing/sitter in room  OT Visit Diagnosis: Unsteadiness on feet (R26.81);Other abnormalities of gait and mobility (R26.89);Muscle weakness (generalized) (M62.81);Other symptoms and signs involving cognitive function                Time: [id number] OT Time Calculation (min): 33 min Charges:  OT General Charges $OT Visit: 1 Visit OT Evaluation $OT Eval Moderate Complexity: 1 Mod  Nestor Lewandowsky, OTR/L Acute Rehabilitation Services Pager: [phone] Office: [phone]  Malka So 03/12/2020, 1:06 PM

## 2020-03-13 ENCOUNTER — Inpatient Hospital Stay (HOSPITAL_COMMUNITY): Payer: Medicare HMO

## 2020-03-13 DIAGNOSIS — N189 Chronic kidney disease, unspecified: Secondary | ICD-10-CM

## 2020-03-13 DIAGNOSIS — R739 Hyperglycemia, unspecified: Secondary | ICD-10-CM

## 2020-03-13 DIAGNOSIS — N179 Acute kidney failure, unspecified: Secondary | ICD-10-CM

## 2020-03-13 LAB — CBC WITH DIFFERENTIAL/PLATELET
Abs Immature Granulocytes: 0.07 10*3/uL (ref 0.00–0.07)
Basophils Absolute: 0.1 10*3/uL (ref 0.0–0.1)
Basophils Relative: 1 %
Eosinophils Absolute: 0.4 10*3/uL (ref 0.0–0.5)
Eosinophils Relative: 5 %
HCT: 39.2 % (ref 36.0–46.0)
Hemoglobin: 12.4 g/dL (ref 12.0–15.0)
Immature Granulocytes: 1 %
Lymphocytes Relative: 19 %
Lymphs Abs: 1.9 10*3/uL (ref 0.7–4.0)
MCH: 29.5 pg (ref 26.0–34.0)
MCHC: 31.6 g/dL (ref 30.0–36.0)
MCV: 93.3 fL (ref 80.0–100.0)
Monocytes Absolute: 1.2 10*3/uL — ABNORMAL HIGH (ref 0.1–1.0)
Monocytes Relative: 12 %
Neutro Abs: 6.2 10*3/uL (ref 1.7–7.7)
Neutrophils Relative %: 62 %
Platelets: 344 10*3/uL (ref 150–400)
RBC: 4.2 MIL/uL (ref 3.87–5.11)
RDW: 13.1 % (ref 11.5–15.5)
WBC: 9.8 10*3/uL (ref 4.0–10.5)
nRBC: 0 % (ref 0.0–0.2)

## 2020-03-13 LAB — COMPREHENSIVE METABOLIC PANEL
ALT: 22 U/L (ref 0–44)
AST: 22 U/L (ref 15–41)
Albumin: 2.6 g/dL — ABNORMAL LOW (ref 3.5–5.0)
Alkaline Phosphatase: 50 U/L (ref 38–126)
Anion gap: 11 (ref 5–15)
BUN: 21 mg/dL (ref 8–23)
CO2: 26 mmol/L (ref 22–32)
Calcium: 8.4 mg/dL — ABNORMAL LOW (ref 8.9–10.3)
Chloride: 101 mmol/L (ref 98–111)
Creatinine, Ser: 1 mg/dL (ref 0.44–1.00)
GFR calc Af Amer: 59 mL/min — ABNORMAL LOW (ref >60)
GFR calc non Af Amer: 51 mL/min — ABNORMAL LOW (ref >60)
Glucose, Bld: 127 mg/dL — ABNORMAL HIGH (ref 70–99)
Potassium: 3.9 mmol/L (ref 3.5–5.1)
Sodium: 138 mmol/L (ref 135–145)
Total Bilirubin: 0.9 mg/dL (ref 0.3–1.2)
Total Protein: 6.4 g/dL — ABNORMAL LOW (ref 6.5–8.1)

## 2020-03-13 LAB — HEPARIN LEVEL (UNFRACTIONATED): Heparin Unfractionated: 0.45 IU/mL (ref 0.30–0.70)

## 2020-03-13 LAB — MAGNESIUM: Magnesium: 2.3 mg/dL (ref 1.7–2.4)

## 2020-03-13 LAB — PHOSPHORUS: Phosphorus: 3.6 mg/dL (ref 2.5–4.6)

## 2020-03-13 MED ORDER — APIXABAN 5 MG PO TABS
10.0000 mg | ORAL_TABLET | Freq: Two times a day (BID) | ORAL | Status: DC
  Administered 2020-03-13 – 2020-03-14 (×3): 10 mg via ORAL
  Filled 2020-03-13 (×4): qty 2

## 2020-03-13 MED ORDER — LEVALBUTEROL HCL 0.63 MG/3ML IN NEBU
0.6300 mg | INHALATION_SOLUTION | Freq: Three times a day (TID) | RESPIRATORY_TRACT | Status: DC
  Administered 2020-03-13 – 2020-03-14 (×4): 0.63 mg via RESPIRATORY_TRACT
  Filled 2020-03-13 (×5): qty 3

## 2020-03-13 MED ORDER — LEVALBUTEROL HCL 0.63 MG/3ML IN NEBU
0.6300 mg | INHALATION_SOLUTION | Freq: Four times a day (QID) | RESPIRATORY_TRACT | Status: DC
  Administered 2020-03-13: 0.63 mg via RESPIRATORY_TRACT
  Filled 2020-03-13: qty 3

## 2020-03-13 MED ORDER — IPRATROPIUM BROMIDE 0.02 % IN SOLN
0.5000 mg | Freq: Three times a day (TID) | RESPIRATORY_TRACT | Status: DC
  Administered 2020-03-13 – 2020-03-14 (×4): 0.5 mg via RESPIRATORY_TRACT
  Filled 2020-03-13 (×5): qty 2.5

## 2020-03-13 MED ORDER — FUROSEMIDE 10 MG/ML IJ SOLN
40.0000 mg | Freq: Once | INTRAMUSCULAR | Status: AC
  Administered 2020-03-13: 40 mg via INTRAVENOUS
  Filled 2020-03-13: qty 4

## 2020-03-13 MED ORDER — IPRATROPIUM BROMIDE 0.02 % IN SOLN
0.5000 mg | Freq: Four times a day (QID) | RESPIRATORY_TRACT | Status: DC
  Administered 2020-03-13: 0.5 mg via RESPIRATORY_TRACT
  Filled 2020-03-13: qty 2.5

## 2020-03-13 MED ORDER — APIXABAN 5 MG PO TABS: 5.0000 mg | ORAL_TABLET | Freq: Two times a day (BID) | ORAL | Status: DC

## 2020-03-13 MED ORDER — METHYLPREDNISOLONE SODIUM SUCC 125 MG IJ SOLR
60.0000 mg | Freq: Once | INTRAMUSCULAR | Status: AC
  Administered 2020-03-13: 60 mg via INTRAVENOUS
  Filled 2020-03-13: qty 2

## 2020-03-13 NOTE — Progress Notes (Signed)
Physical Therapy Treatment Patient Details Name: Brianna Pineda MRN: [id number] DOB: 09/04/1932 Today's Date: 03/13/2020    History of Present Illness Patient admitted from home with increased weakness, unable to get out of bed and walk, fatigue. O2 saturations were 62% at admission. PMH includes: dementia    PT Comments    Patient received in bed, just finished with breathing treatment. Husband present. Patient with increased confusion and lethargy this morning. Agreeable to PT session. Patient required mod +2 assist with bed mobility. Requires mod +2 assist for sit to stand transfer from elevated surface. Patient with increased time needed with all mobility this session. Decreased initiation of movement and more easily distracted. Patient was able to take a few small steps from bed to recliner with min +2 assist. Patient will continue to benefit from skilled PT to improve strength and functional independence for return home with family assist. She would benefit from SNF, but husband wants to bring her home.       Follow Up Recommendations  SNF;Home health PT;Supervision/Assistance - 24 hour     Equipment Recommendations  None recommended by PT    Recommendations for Other Services       Precautions / Restrictions Precautions Precautions: Fall Restrictions Weight Bearing Restrictions: No    Mobility  Bed Mobility Overal bed mobility: Needs Assistance Bed Mobility: Supine to Sit     Supine to sit: Mod assist;+2 for physical assistance     General bed mobility comments: mod assist +2 needed for supine to sit at edge of bed. Decreased balance in sitting noted.  Transfers Overall transfer level: Needs assistance Equipment used: Rolling walker (2 wheeled) Transfers: Sit to/from [employer] Sit to Stand: Mod assist;+2 physical assistance;From elevated surface Stand pivot transfers: Mod assist;+2 physical assistance       General transfer comment: cues for  hand placement for sit to stand. Cues for upright posture. Assist needed to move walker and move safety.  Ambulation/Gait Ambulation/Gait assistance: Min assist Gait Distance (Feet): 3 Feet Assistive device: Rolling walker (2 wheeled) Gait Pattern/deviations: Step-to pattern;Trunk flexed;Decreased step length - right;Decreased step length - left;Shuffle Gait velocity: decr   General Gait Details: able to take a few steps from bed to recliner, however fatigued with this, sat in recliner prior to being fully ready, did not reach back prior to sitting. .   Stairs             Wheelchair Mobility    Modified Rankin (Stroke Patients Only)       Balance Overall balance assessment: Needs assistance Sitting-balance support: Feet supported;Single extremity supported Sitting balance-Leahy Scale: Fair Sitting balance - Comments: requires close supervision and occasional assist with sitting balance this day Postural control: Right lateral lean Standing balance support: Bilateral upper extremity supported;During functional activity Standing balance-Leahy Scale: Poor Standing balance comment: reliant on RW and external support for safety, posterior leaning with initial standing balance                            Cognition Arousal/Alertness: Awake/alert;Lethargic Behavior During Therapy: WFL for tasks assessed/performed Overall Cognitive Status: History of cognitive impairments - at baseline                                 General Comments: oriented to person, increased confusion noted this day      Exercises  General Comments        Pertinent Vitals/Pain Pain Assessment: No/denies pain    Home Living Family/patient expects to be discharged to:: Private residence                    Prior Function            PT Goals (current goals can now be found in the care plan section) Acute Rehab PT Goals Patient Stated Goal: to return home,  discussed with husband patient's ability to go home. He wants to take her home as she has some confusion. PT Goal Formulation: With patient/family Time For Goal Achievement: 03/26/20 Potential to Achieve Goals: Fair Progress towards PT goals: Progressing toward goals    Frequency    Min 3X/week      PT Plan Discharge plan needs to be updated    Co-evaluation              AM-PAC PT "6 Clicks" Mobility   Outcome Measure  Help needed turning from your back to your side while in a flat bed without using bedrails?: A Lot Help needed moving from lying on your back to sitting on the side of a flat bed without using bedrails?: A Lot Help needed moving to and from a bed to a chair (including a wheelchair)?: A Lot Help needed standing up from a chair using your arms (e.g., wheelchair or bedside chair)?: A Lot Help needed to walk in hospital room?: A Lot Help needed climbing 3-5 steps with a railing? : Total 6 Click Score: 11    End of Session Equipment Utilized During Treatment: Gait belt;Oxygen Activity Tolerance: Patient limited by fatigue Patient left: in chair;with call bell/phone within reach;with family/visitor present Nurse Communication: Mobility status PT Visit Diagnosis: Muscle weakness (generalized) (M62.81);Other abnormalities of gait and mobility (R26.89);Difficulty in walking, not elsewhere classified (R26.2);Unsteadiness on feet (R26.81)     Time: [id number] PT Time Calculation (min) (ACUTE ONLY): 28 min  Charges:  $Gait Training: 8-22 mins $Therapeutic Activity: 8-22 mins                     Halena Mohar, PT, GCS 03/13/20,12:35 PM

## 2020-03-13 NOTE — Plan of Care (Signed)

## 2020-03-13 NOTE — TOC Progression Note (Addendum)
Transition of Care [facility]) - Progression Note    Patient Details  Name: Brianna Pineda MRN: [id number] Date of Birth: 02-Aug-1932  [facility] CM/SW Contact  Angelita Ingles, RN Phone Number: 03/13/2020, 3:46 PM  Clinical Narrative:    CM at bedside to set up DME and [facility] PT/OT per orders. DME wheelchair has been previously set up through [facility]. Facility verified that they have a wheelchair to deliver per previous orders. Address verified for [facility] with [facility]. Wheelchair to be delivered to the home on 03/14/2020 husband at the bedside and is aware that facility will call on cell or home phone prior to delivery. Home health services set up with [facility] given to [facility] with [place]. Information has been added to AVS.  Expected Discharge Plan: Home/Self Care(with husband) Barriers to Discharge: Continued Medical Work up  Expected Discharge Plan and Services Expected Discharge Plan: Home/Self Care(with husband)   Discharge Planning Services: CM Consult   Living arrangements for the past 2 months: Single Family Home(lived in lower level of sons home)                                       Social Determinants of Health (SDOH) Interventions    Readmission Risk Interventions No flowsheet data found.

## 2020-03-13 NOTE — Progress Notes (Signed)
[place] for Heparin >> Eliquis Indication: pulmonary embolus, LLE DVT  Allergies  Allergen Reactions  . Other     NO BLOOD OR BLOOD PRODUCTS - Jehovah's witness    Patient Measurements: Wt = 110kg Ht = 5' 6''  IBW = 59 kg Heparin dosing wt: 85kg  Vital Signs: Temp: 98 F (36.7 C) (03/30 0812) BP: 154/74 (03/30 0812) Pulse Rate: 80 (03/30 0812)  Labs: Recent Labs    03/11/20 0215 03/11/20 0215 03/11/20 0834 03/11/20 1753 03/11/20 1938 03/12/20 0247 03/13/20 0248  HGB 12.7   < >  --   --   --  12.1 12.4  HCT 39.0  --   --   --   --  38.0 39.2  PLT 302  --   --   --   --  304 344  HEPARINUNFRC 0.37  --   --   --   --  0.42 0.45  CREATININE  --   --  1.12*  --   --  1.04* 1.00  TROPONINIHS  --   --   --  59* 58*  --   --    < > = values in this interval not displayed.    Estimated Creatinine Clearance: 51.4 mL/min (by C-G formula based on SCr of 1 mg/dL).   Assessment: 47 YOF with SOB and CT (at [facility]) showed saddle PE. Dopplers also +LLE DVT.  Patient has been on therapeutic heparin and now to transition to Eliquis.  No bleeding reported.  Goal of Therapy:  Heparin level 0.3-0.7 units/ml Monitor platelets by anticoagulation protocol: Yes   Plan:  D/C heparin gtt when Eliquis is administered Eliquis 10mg  PO BID x 7 days, then on 03/20/20 start 5mg  PO BID Pharmacy will sign off and follow peripherally.  Thank you for the consult!  Yonah Tangeman D. Mina Marble, PharmD, BCPS, [place] 03/13/2020, 12:44 PM

## 2020-03-13 NOTE — Progress Notes (Signed)
Patient's relative refused PPV tonight.

## 2020-03-13 NOTE — Progress Notes (Signed)
PROGRESS NOTE    Brianna Pineda  [id number] DOB: Jan 15, 1932 DOA: 03/09/2020 PCP: Nicoletta Dress, MD   Brief Narrative:  HPI per Dr. Chesley Mires on 03/09/20 86 yo female seen by PCP for several days of dyspnea and fatigue.  She has baseline history of dementia and reportedly requires total care.  In ER at [facility] noted to have SpO2 88%.  ABG showed PaO2 62 on room air.  Creatinine 1.60, lactic acid 0.9, Troponin I 0.42, Hb 12.9, PLT 250.  CXR reports 2.3 x 2.2 cm opacity in Lt base.  CT angio chest showed b/l saddle PE with RV:LV ratio 1.23 and Lt basilar ATX with small effusion.  **Interim History  Was placed on a heparin drip and has been delirious and impulsive previously and required a telemetry sitter but is improved  She remained on oxygen 5-6 L currently and will wean further and is now on 3 Liters.  She is also found to have an extensive left leg DVT.  Her renal function is improving and mentation is improved as well from her husband.    She was witnessed to aspirate and cough on her coffee however she was not properly seated with aspiration precautions.  SLP reevaluated and recommending regular diet with thin liquids still.  We will attempt to wean her off of oxygen and likely transition from a heparin drip to a NOAC this a.m. PT OT evaluation recommending SNF but patient's husband wants to bring her home with home health and anticipating discharging home in next 24 to 48 hours given that she still was having some wheezing and some dyspnea so we will give her some more diuresis as well as start breathing treatments and Solu-Medrol x1 dose.  Assessment & Plan:   Active Problems:   Acute pulmonary embolism (HCC)  Acute Hypoxemic Respiratory Failure secondary to Acute submassive pulmonary embolism with complications of an Acute Left Leg DVT -Admitted to PCCM and now transferred to [facility] on 03/11/20 -Per PCCM the patient's age precludes half dose systemic thrombolytic therapy (risks vs  benefits discussed with patient's husband) and they are not going forward with Systemic Thrombolytic Therapy and it was deferred on admission -Will consider IVC filter at some point given that if she is unable to tolerate heparin and has potential bleeding complications related to fall risk  -PESI Class at least a 3 -Will continue Heparin gtt for at least 3 Days prior to transition to [place] and will reach out to pharmacy to transition to Eliquis today -Check Troponin Levels with elevated at 59 and trended down to 48 -Lower Extremity Dopplers done and showed "Findings consistent with acute deep vein thrombosis involving the left femoral vein, left popliteal vein, left posterior tibial veins, and left  peroneal veins." -Continuous pulse oximetry and maintain O2 saturations greater than 90% -SpO2: 90 % O2 Flow Rate (L/min): 3 L/min; wean O2 as tolerated and asked the nurse to wean down as able -Patient was more rhonchorous a little bit this morning as well as some wheezing so we will give her a dose of Solu-Medrol 40 mg x 1 and also start scheduled Xopenex and Atrovent every 6h; I have also ordered some more IV Lasix 40 mg x 1 -Continue supplemental oxygen via nasal cannula and wean O2 as tolerated -ECHOCardiogram showed "Left ventricular ejection fraction, by estimation, is 55 to 60%. The left ventricle has normal function. The left ventricle has no regional wall motion abnormalities. The left ventricular internal cavity size was normal  in size. There is no left ventricular hypertrophy. Left ventricular diastolic parameters are consistent with Grade I diastolic dysfunction (impaired relaxation). Right Ventricle: The right ventricular size is mildly enlarged. Right vetricular wall thickness was not assessed. Right ventricular systolic function is mildly reduced. There is moderately elevated pulmonary artery systolic pressure. The tricuspid regurgitant velocity is 3.18 m/s, and with an assumed right atrial  pressure of 3 mmHg, the estimated right ventricular systolic pressure is [id number] mmHg." -Will need an Ambulatory Home O2 Screen prior to D/C  -Repeat CXR this AM showed "Mediastinum hilar structures normal. Heart size stable. Mild bilateral interstitial prominence consistent with interstitial edema and/or pneumonitis. Small bilateral pleural effusions. No pneumothorax." -Continue monitor respiratory status carefully and repeat chest x-ray in a.m.  AKI on questionable stage III CKD Hyperphosphatemia  -Patient's BUN/creatinine went from 30/1.50 -> 33/1.12 -> 26/1.04 -> 21/1.00 -Phos level was 5.0 now is 3.6 -Continue with Lasix 40 mg p.o. daily; she received a dose of IV Lasix 40 mg yesterday a.m. given that speech was to reevaluate; will give an additional IV Lasix 40 mg today given the crackles on rhonchi heard on examination today. -Avoid nephrotoxic medications, contrast dyes, hypotension and renally adjust medications -Repeat CMP in the a.m.  Acute on Chronic Diastolic CHF -Echo showed grade 1 diastolic dysfunction -BNP on admission was 935.9 -Continues to be slightly volume overloaded but husband states that her legs are much improved since she came in -Chest x-ray this AM showed "Low volume chest with mild atelectatic type density at the bases. Cardiomegaly without edema." -Continue with Lasix 40 mg po Daily; received a dose of IV Lasix yesterday morning and will give another dose of IV 40 mg Lasix today -Patient is -2.9873 Liters since admission; weight is down 3 pounds since admission was not checked the last 4 days; Will re-order Daily Weights  -Continue to Monitor Volume Status carefully and repeat chest x-ray in a.m.  History of OSA -Continue CPAP nightly  History of Dementia with Behavioral Disturbances Chronic pain -Obtained SLP and recommending Regular Diet with Thin Liquids even after she is observed to have coughing after drinking her coffee; SLP feel that she needs to be  significantly upright for her not to aspirate -Continue with memantine 10 mg p.o. twice daily, rivastigmine 9.5 mg transdermal patch, and venlafaxine XR 75 mg p.o. daily -Continue with methocarbamol [phone] mg p.o. every 6 as needed muscle spasm -Continued safety sitter one-to-one -Given Haldol 0.5 mg IV given during hospitalization -Seems to be improved today   HTN -Continue with Lisinopril 20 mg p.o. daily and with hydralazine 10 mg IV every 4 as needed for systolic blood pressure again 160 as well as the metoprolol tartrate tartrate 5 mg IV every 4 hours as needed for systolic blood pressure when 160 or heart rate greater than 110.   [employer] - accepts no blood products  Leukocytosis, improving -Mild at 10.8 and improved to 9.8 -In the setting of Acute PE  -Continue to monitor for signs and symptoms of infection; currently she is afebrile -Repeat CBC in the a.m.  Goals of care, DNR  -Patient's husband at bedside today -She has progressive dementia and has become more sedentary with increasing care needs -She is DNR/DNI -PCCM discussed risk/benefit ratio of thrombolytic therapy with husband >> he is in agreement to defer this unless clinical status gets worse; will hold off on thrombolytics and transition to p.o. anticoagulation now  Obesity -Estimated body mass index is 38.68 kg/m as calculated  from the following:   Height as of this encounter: 5\' 6"  (1.676 m).   Weight as of this encounter: 108.7 kg. -Weight Loss and Dietary Counseling given   Hyperglycemia -Patient blood sugar has been elevated on daily BMP/CMP in range from 115-132; blood sugar on today's CMP was 127 -No hemoglobin A1c on file and will not check while she is hospitalized -Continue monitor blood sugars carefully and if necessary will place on sensitive NovoLog sliding scale insulin AC  DVT prophylaxis: Anticoagulated with a heparin drip Code Status: DO NOT RESUSCITATE  Family Communication:  Discussed with the patient's husband at bedside Disposition Plan: Will need PT/OT to Evaluate and Treat and be transitioned to po Anticoagulation and she is now recommended for SNF but husband and family want to bring her home as they feel that this support and so she will be discharged with home health PT and OT.  I will transition to p.o. anticoagulation today and will need to do a home Ambulatory screen prior to discharge.;  Anticipating discharge in next 24 to 48 hours given that she had some continued rhonchi and crackles today on examination and will give an additional dose of IV Lasix as well as starting on breathing treatments Xopenex and Atrovent scheduled and given a one-time dose of IV Solu-Medrol.  In addition will also monitor her carefully to see if she has any bleeding on oral anticoagulation as she is a Sales promotion account executive Witness  Consultants:   PCCM Transfer  Procedures:  ECHOCARDIOGRAM IMPRESSIONS    1. Left ventricular ejection fraction, by estimation, is 55 to 60%. The  left ventricle has normal function. The left ventricle has no regional  wall motion abnormalities. Left ventricular diastolic parameters are  consistent with Grade I diastolic  dysfunction (impaired relaxation).  2. Right ventricular systolic function is mildly reduced. The right  ventricular size is mildly enlarged. There is moderately elevated  pulmonary artery systolic pressure. The estimated right ventricular  systolic pressure is [id number] mmHg.  3. The mitral valve is normal in structure. No evidence of mitral valve  regurgitation. No evidence of mitral stenosis.  4. The aortic valve is normal in structure. Aortic valve regurgitation is  not visualized. No aortic stenosis is present.   FINDINGS  Left Ventricle: Left ventricular ejection fraction, by estimation, is 55  to 60%. The left ventricle has normal function. The left ventricle has no  regional wall motion abnormalities. The left ventricular  internal cavity  size was normal in size. There is  no left ventricular hypertrophy. Left ventricular diastolic parameters  are consistent with Grade I diastolic dysfunction (impaired relaxation).   Right Ventricle: The right ventricular size is mildly enlarged. Right  vetricular wall thickness was not assessed. Right ventricular systolic  function is mildly reduced. There is moderately elevated pulmonary artery  systolic pressure. The tricuspid  regurgitant velocity is 3.18 m/s, and with an assumed right atrial  pressure of 3 mmHg, the estimated right ventricular systolic pressure is  [id number] mmHg.   Left Atrium: Left atrial size was normal in size.   Right Atrium: Right atrial size was normal in size.   Pericardium: There is no evidence of pericardial effusion.   Mitral Valve: The mitral valve is normal in structure. No evidence of  mitral valve regurgitation. No evidence of mitral valve stenosis.   Tricuspid Valve: The tricuspid valve is normal in structure. Tricuspid  valve regurgitation is trivial. No evidence of tricuspid stenosis.   Aortic Valve: The aortic valve is  normal in structure. Aortic valve  regurgitation is not visualized. No aortic stenosis is present.   Pulmonic Valve: The pulmonic valve was grossly normal. Pulmonic valve  regurgitation is not visualized. No evidence of pulmonic stenosis.   Aorta: The aortic root and ascending aorta are structurally normal, with  no evidence of dilitation.   IAS/Shunts: The atrial septum is grossly normal.     LEFT VENTRICLE  PLAX 2D  LVIDd:     3.80 cm Diastology  LVIDs:     2.20 cm LV e' lateral:  9.03 cm/s  LV PW:     1.10 cm LV E/e' lateral: 8.2  LV IVS:    1.00 cm LV e' medial:  7.18 cm/s  LVOT diam:   2.00 cm LV E/e' medial: 10.3  LV SV:     61  LV SV Index:  28  LVOT Area:   3.14 cm     RIGHT VENTRICLE  RV S prime:   18.80 cm/s   LEFT ATRIUM       Index     RIGHT ATRIUM      Index  LA diam:    3.80 cm 1.76 cm/m RA Area:   15.30 cm  LA Vol (A2C):  45.2 ml 20.93 ml/m RA Volume:  34.90 ml 16.16 ml/m  LA Vol (A4C):  38.1 ml 17.64 ml/m  LA Biplane Vol: 41.6 ml 19.26 ml/m  AORTIC VALVE  LVOT Vmax:  118.00 cm/s  LVOT Vmean: 65.900 cm/s  LVOT VTI:  0.194 m    AORTA  Ao Root diam: 2.80 cm  Ao Asc diam: 3.10 cm   MITRAL VALVE        TRICUSPID VALVE  MV Area (PHT): 3.48 cm   TR Peak grad:  40.4 mmHg  MV Decel Time: 218 msec   TR Vmax:    318.00 cm/s  MV E velocity: 74.10 cm/s  MV A velocity: 104.00 cm/s SHUNTS  MV E/A ratio: 0.71     Systemic VTI: 0.19 m               Systemic Diam: 2.00 cm   LE VENOUS DUPLEX RIGHT:  - There is no evidence of deep vein thrombosis in the lower extremity.    LEFT:  - Findings consistent with acute deep vein thrombosis involving the left  femoral vein, left popliteal vein, left posterior tibial veins, and left  peroneal veins.    Antimicrobials:  Anti-infectives (From admission, onward)   None     Subjective: Seen And examined at bedside and and was resting but seem more awake yesterday.  Note complaints but she still short of breath and patient's husband states that they wean her oxygen down to 3 L she had slightly increased work of breathing chest pain, shortness of breath now but she is audibly wheezing.  No acute events reported overnight  Objective: Vitals:   03/13/20 0500 03/13/20 0812 03/13/20 1130 03/13/20 1221  BP:  (!) 154/74    Pulse:  80    Resp: (!) 22 17    Temp:  98 F (36.7 C)    TempSrc:      SpO2:  95% 95% 90%  Weight:      Height:        Intake/Output Summary (Last 24 hours) at 03/13/2020 1230 Last data filed at 03/13/2020 0507 Gross per 24 hour  Intake 120 ml  Output 500 ml  Net -380 ml   Filed Weights   03/09/20 1545 03/10/20 0500  Weight: 110.1 kg 108.7 kg   Examination: Physical Exam:   Constitutional: WN/WD obese Caucasian female in  NAD and appears calm and resting and not as alert as she was yesterday Eyes: Lids and conjunctivae normal, sclerae anicteric  ENMT: External Ears, Nose appear normal. Grossly normal hearing.   Neck: Appears normal, supple, no cervical masses, normal ROM, no appreciable thyromegaly; no JVD Respiratory: Diminished to auscultation bilaterally with coarse breath sounds and mild wheezing and crackles; No appreciable rales, rhonchi . Normal respiratory effort and patient is not tachypenic. No accessory muscle use.  Wearing 3 L of supplemental oxygen via nasal cannula Cardiovascular: RRR, no murmurs / rubs / gallops. S1 and S2 auscultated.  Has 1+ extremity edema bilaterally with slightly worse in the left compared to right Abdomen: Soft, non-tender, distended secondary body habitus. Bowel sounds positive x4.  GU: Deferred. Musculoskeletal: No clubbing / cyanosis of digits/nails. No joint deformity upper and lower extremities.   Skin: No rashes, lesions, ulcers on limited skin evaluation. No induration; Warm and dry.  Neurologic: CN 2-12 grossly intact with no focal deficits. Romberg sign and cerebellar reflexes not assessed.  Psychiatric: Slightly impaired judgment and insight.  Slightly sleepy but she is awake and has a normal mood and appropriate affect.   Data Reviewed: I have personally reviewed following labs and imaging studies  CBC: Recent Labs  Lab 03/10/20 0332 03/11/20 0215 03/12/20 0247 03/13/20 0248  WBC 10.8* 10.1 9.2 9.8  NEUTROABS  --   --  5.8 6.2  HGB 12.3 12.7 12.1 12.4  HCT 38.0 39.0 38.0 39.2  MCV 93.1 92.6 94.8 93.3  PLT 269 302 304 [id number]   Basic Metabolic Panel: Recent Labs  Lab 03/10/20 0332 03/11/20 0834 03/12/20 0247 03/13/20 0248  NA 139 141 142 138  K 4.1 4.4 4.3 3.9  CL 102 104 104 101  CO2 24 25 27 26   GLUCOSE 104* 132* 115* 127*  BUN 30* 33* 26* 21  CREATININE 1.50* 1.12* 1.04* 1.00  CALCIUM 9.0 8.9  8.5* 8.4*  MG  --  2.5* 2.3 2.3  PHOS  --  5.0* 4.0 3.6   GFR: Estimated Creatinine Clearance: 51.4 mL/min (by C-G formula based on SCr of 1 mg/dL). Liver Function Tests: Recent Labs  Lab 03/10/20 0332 03/11/20 0834 03/12/20 0247 03/13/20 0248  AST 18 26 25 22   ALT 15 20 19 22   ALKPHOS 55 50 51 50  BILITOT 1.1 0.9 0.7 0.9  PROT 6.9 6.8 6.3* 6.4*  ALBUMIN 3.1* 2.8* 2.6* 2.6*   No results for input(s): LIPASE, AMYLASE in the last 168 hours. No results for input(s): AMMONIA in the last 168 hours. Coagulation Profile: No results for input(s): INR, PROTIME in the last 168 hours. Cardiac Enzymes: No results for input(s): CKTOTAL, CKMB, CKMBINDEX, TROPONINI in the last 168 hours. BNP (last 3 results) No results for input(s): PROBNP in the last 8760 hours. HbA1C: No results for input(s): HGBA1C in the last 72 hours. CBG: Recent Labs  Lab 03/09/20 1935 03/09/20 2337 03/10/20 0340 03/10/20 0720 03/10/20 1112  GLUCAP 110* 107* 98 123* 114*   Lipid Profile: No results for input(s): CHOL, HDL, LDLCALC, TRIG, CHOLHDL, LDLDIRECT in the last 72 hours. Thyroid Function Tests: No results for input(s): TSH, T4TOTAL, FREET4, T3FREE, THYROIDAB in the last 72 hours. Anemia Panel: No results for input(s): VITAMINB12, FOLATE, FERRITIN, TIBC, IRON, RETICCTPCT in the last 72 hours. Sepsis Labs: No results for input(s): PROCALCITON, LATICACIDVEN in the last 168 hours.  Recent  Results (from the past 240 hour(s))  MRSA PCR Screening     Status: None   Collection Time: 03/09/20  3:40 PM   Specimen: Nasopharyngeal  Result Value Ref Range Status   MRSA by PCR NEGATIVE NEGATIVE Final    Comment:        The GeneXpert MRSA Assay (FDA approved for NASAL specimens only), is one component of a comprehensive MRSA colonization surveillance program. It is not intended to diagnose MRSA infection nor to guide or monitor treatment for MRSA infections. Performed at [facility], [place] [address], [place], [place] [address]     RN Pressure Injury Documentation:     Estimated body mass index is 38.68 kg/m as calculated from the following:   Height as of this encounter: 5\' 6"  (1.676 m).   Weight as of this encounter: 108.7 kg.  Malnutrition Type:      Malnutrition Characteristics:      Nutrition Interventions:    Radiology Studies: DG CHEST PORT 1 VIEW  Result Date: 03/13/2020 CLINICAL DATA:  Shortness of breath. EXAM: PORTABLE CHEST 1 VIEW COMPARISON:  03/12/2020 FINDINGS: Mediastinum hilar structures normal. Heart size stable. Mild bilateral interstitial prominence consistent with interstitial edema and/or pneumonitis. Small bilateral pleural effusions. No pneumothorax. IMPRESSION: Mild bilateral interstitial prominence consistent with interstitial edema and/or pneumonitis. Small bilateral pleural effusions. Electronically Signed   By: Marcell[facility]  Register   On: 03/13/2020 08:58   DG CHEST PORT 1 VIEW  Result Date: 03/12/2020 CLINICAL DATA:  Shortness of breath EXAM: PORTABLE CHEST 1 VIEW COMPARISON:  Three days ago FINDINGS: Cardiomegaly with mild streaky density at the bases. Lung volumes are low. No effusion or pneumothorax. IMPRESSION: 1. Low volume chest with mild atelectatic type density at the bases. 2. Cardiomegaly without edema. Electronically Signed   By: Monte Fantasia M.D.   On: 03/12/2020 07:17   Scheduled Meds: . Chlorhexidine Gluconate Cloth  6 each Topical Daily  . cholecalciferol  1,000 Units Oral Daily  . darifenacin  7.5 mg Oral Daily  . furosemide  40 mg Oral Daily  . guaiFENesin  1,200 mg Oral BID  . ipratropium  0.5 mg Nebulization Q6H  . levalbuterol  0.63 mg Nebulization Q6H  . lisinopril  20 mg Oral Daily  . mouth rinse  15 mL Mouth Rinse BID  . memantine  10 mg Oral BID  . mirabegron ER  50 mg Oral Daily  . multivitamin with minerals  1 tablet Oral Daily  . rivastigmine  9.5 mg Transdermal Daily  . venlafaxine XR  75 mg Oral Q  breakfast   Continuous Infusions: . heparin 1,500 Units/hr (03/13/20 0507)    LOS: 4 days   Kerney Elbe, DO [facility] PAGER is on [place]  If 7PM-7AM, please contact night-coverage [email or web address]

## 2020-03-13 NOTE — Discharge Instructions (Signed)
Information on my medicine - ELIQUIS (apixaban)  This medication education was reviewed with me or my healthcare representative as part of my discharge preparation.    Why was Eliquis prescribed for you? Eliquis was prescribed to treat blood clots that may have been found in the veins of your legs (deep vein thrombosis) or in your lungs (pulmonary embolism) and to reduce the risk of them occurring again.  What do You need to know about Eliquis ? The starting dose is 10 mg (two 5 mg tablets) taken TWICE daily for the FIRST SEVEN (7) DAYS, then on (enter date)  03/20/20  the dose is reduced to ONE 5 mg tablet taken TWICE daily.  Eliquis may be taken with or without food.   Try to take the dose about the same time in the morning and in the evening. If you have difficulty swallowing the tablet whole please discuss with your pharmacist how to take the medication safely.  Take Eliquis exactly as prescribed and DO NOT stop taking Eliquis without talking to the doctor who prescribed the medication.  Stopping may increase your risk of developing a new blood clot.  Refill your prescription before you run out.  After discharge, you should have regular check-up appointments with your healthcare provider that is prescribing your Eliquis.    What do you do if you miss a dose? If a dose of ELIQUIS is not taken at the scheduled time, take it as soon as possible on the same day and twice-daily administration should be resumed. The dose should not be doubled to make up for a missed dose.  Important Safety Information A possible side effect of Eliquis is bleeding. You should call your healthcare provider right away if you experience any of the following: ? Bleeding from an injury or your nose that does not stop. ? Unusual colored urine (red or dark brown) or unusual colored stools (red or black). ? Unusual bruising for unknown reasons. ? A serious fall or if you hit your head (even if there is no  bleeding).  Some medicines may interact with Eliquis and might increase your risk of bleeding or clotting while on Eliquis. To help avoid this, consult your healthcare provider or pharmacist prior to using any new prescription or non-prescription medications, including herbals, vitamins, non-steroidal anti-inflammatory drugs (NSAIDs) and supplements.  This website has more information on Eliquis (apixaban): http://www.eliquis.com/eliquis/home

## 2020-03-14 ENCOUNTER — Inpatient Hospital Stay (HOSPITAL_COMMUNITY): Payer: Medicare HMO

## 2020-03-14 DIAGNOSIS — F0391 Unspecified dementia with behavioral disturbance: Secondary | ICD-10-CM | POA: Diagnosis present

## 2020-03-14 DIAGNOSIS — I5031 Acute diastolic (congestive) heart failure: Secondary | ICD-10-CM

## 2020-03-14 DIAGNOSIS — I2609 Other pulmonary embolism with acute cor pulmonale: Secondary | ICD-10-CM | POA: Diagnosis present

## 2020-03-14 DIAGNOSIS — R059 Cough, unspecified: Secondary | ICD-10-CM

## 2020-03-14 DIAGNOSIS — F03918 Unspecified dementia, unspecified severity, with other behavioral disturbance: Secondary | ICD-10-CM | POA: Diagnosis present

## 2020-03-14 DIAGNOSIS — R05 Cough: Secondary | ICD-10-CM

## 2020-03-14 DIAGNOSIS — I10 Essential (primary) hypertension: Secondary | ICD-10-CM

## 2020-03-14 DIAGNOSIS — R0602 Shortness of breath: Secondary | ICD-10-CM

## 2020-03-14 DIAGNOSIS — J9601 Acute respiratory failure with hypoxia: Secondary | ICD-10-CM | POA: Diagnosis present

## 2020-03-14 LAB — COMPREHENSIVE METABOLIC PANEL
ALT: 31 U/L (ref 0–44)
AST: 28 U/L (ref 15–41)
Albumin: 2.7 g/dL — ABNORMAL LOW (ref 3.5–5.0)
Alkaline Phosphatase: 50 U/L (ref 38–126)
Anion gap: 13 (ref 5–15)
BUN: 22 mg/dL (ref 8–23)
CO2: 27 mmol/L (ref 22–32)
Calcium: 8.5 mg/dL — ABNORMAL LOW (ref 8.9–10.3)
Chloride: 98 mmol/L (ref 98–111)
Creatinine, Ser: 0.96 mg/dL (ref 0.44–1.00)
GFR calc Af Amer: >60 mL/min (ref >60)
GFR calc non Af Amer: 54 mL/min — ABNORMAL LOW (ref >60)
Glucose, Bld: 126 mg/dL — ABNORMAL HIGH (ref 70–99)
Potassium: 4.3 mmol/L (ref 3.5–5.1)
Sodium: 138 mmol/L (ref 135–145)
Total Bilirubin: 0.4 mg/dL (ref 0.3–1.2)
Total Protein: 6.5 g/dL (ref 6.5–8.1)

## 2020-03-14 LAB — CBC WITH DIFFERENTIAL/PLATELET
Abs Immature Granulocytes: 0.25 10*3/uL — ABNORMAL HIGH (ref 0.00–0.07)
Basophils Absolute: 0 10*3/uL (ref 0.0–0.1)
Basophils Relative: 0 %
Eosinophils Absolute: 0 10*3/uL (ref 0.0–0.5)
Eosinophils Relative: 0 %
HCT: 39.3 % (ref 36.0–46.0)
Hemoglobin: 12.6 g/dL (ref 12.0–15.0)
Immature Granulocytes: 2 %
Lymphocytes Relative: 12 %
Lymphs Abs: 1.5 10*3/uL (ref 0.7–4.0)
MCH: 30.1 pg (ref 26.0–34.0)
MCHC: 32.1 g/dL (ref 30.0–36.0)
MCV: 93.8 fL (ref 80.0–100.0)
Monocytes Absolute: 1 10*3/uL (ref 0.1–1.0)
Monocytes Relative: 8 %
Neutro Abs: 10.2 10*3/uL — ABNORMAL HIGH (ref 1.7–7.7)
Neutrophils Relative %: 78 %
Platelets: 370 10*3/uL (ref 150–400)
RBC: 4.19 MIL/uL (ref 3.87–5.11)
RDW: 12.9 % (ref 11.5–15.5)
WBC: 13 10*3/uL — ABNORMAL HIGH (ref 4.0–10.5)
nRBC: 0 % (ref 0.0–0.2)

## 2020-03-14 LAB — MAGNESIUM: Magnesium: 2.5 mg/dL — ABNORMAL HIGH (ref 1.7–2.4)

## 2020-03-14 LAB — PHOSPHORUS: Phosphorus: 3.5 mg/dL (ref 2.5–4.6)

## 2020-03-14 MED ORDER — BENZONATATE 100 MG PO CAPS: 100.0000 mg | ORAL_CAPSULE | Freq: Three times a day (TID) | ORAL | 0 refills | Status: AC | PRN

## 2020-03-14 MED ORDER — FUROSEMIDE 40 MG PO TABS: 20.0000 mg | ORAL_TABLET | ORAL | Status: AC

## 2020-03-14 MED ORDER — APIXABAN (ELIQUIS) VTE STARTER PACK (10MG AND 5MG): ORAL_TABLET | ORAL | 0 refills | Status: DC

## 2020-03-14 NOTE — Care Management (Signed)
Per [facility]: Co-pay amount for Eliquis 5mg ,for a 30 day supply bid $9.20.  Mail order pharmacy [facility] for a 90 day supply bid $9.20.  No PA required No Deductible Tier 3 Retail [facility]

## 2020-03-14 NOTE — TOC Progression Note (Addendum)
Transition of Care [facility]) - Progression Note    Patient Details  Name: Brianna Pineda MRN: [id number] Date of Birth: October 25, 1932  [facility] CM/SW Contact  Angelita Ingles, RN Phone Number: 03/14/2020, 2:49 PM  Clinical Narrative:    CM at bedside to speak with husband per husbands request. CM updated husband on conversation from yesterday and assured him that [facility] services have been set up with [facility] per patients request on yesterday. Also reminded husband that wheelchair was to be delivered today. Husband and patient have been updated that Eliquis has a $9.20 copay. Husband is ok with the copay. No further needs at this time. Will continue to follow.    Expected Discharge Plan: Home/Self Care(with husband) Barriers to Discharge: Continued Medical Work up  Expected Discharge Plan and Services Expected Discharge Plan: Home/Self Care(with husband)   Discharge Planning Services: CM Consult   Living arrangements for the past 2 months: Single Family Home(lived in lower level of sons home)                                       Social Determinants of Health (SDOH) Interventions    Readmission Risk Interventions No flowsheet data found.

## 2020-03-14 NOTE — Progress Notes (Signed)
SATURATION QUALIFICATIONS: (This note is used to comply with regulatory documentation for home oxygen)  Patient Saturations on Room Air at Rest = 92%  Patient Saturations on Room Air while Ambulating = 85%  Patient Saturations on 2 Liters of oxygen while Ambulating = 86%  Please briefly explain why patient needs home oxygen:

## 2020-03-14 NOTE — Progress Notes (Signed)
Occupational Therapy Treatment Patient Details Name: Brianna Pineda MRN: [id number] DOB: [birth year] Today's Date: 03/14/2020    History of present illness Patient admitted from home with increased weakness, unable to get out of bed and walk, fatigue. O2 saturations were 62% at admission. PMH includes: dementia   OT comments  Pt pleasant even though awakened for therapy. Performed bed mobility with min assist, transferred with +2 min primarily for safety and completed grooming in sitting once seated in chair. Pt with Sp02 briefly down to 85-87% with exertion on RA, but maintained at 94% when not exerting on RA. Pt needing cues to not hold her breath during transfer. Left pt off 02 at end of session, RN notified.  Follow Up Recommendations  Home health OT;Supervision/Assistance - 24 hour    Equipment Recommendations  Wheelchair (measurements OT);Wheelchair cushion (measurements OT)    Recommendations for Other Services      Precautions / Restrictions Precautions Precautions: Fall Precaution Comments: monitor 02 Restrictions Weight Bearing Restrictions: No       Mobility Bed Mobility Overal bed mobility: Needs Assistance Bed Mobility: Supine to Sit     Supine to sit: Min assist     General bed mobility comments: increased time, use of rail, assist with use of pad to position hips at EOB  Transfers Overall transfer level: Needs assistance Equipment used: Rolling walker (2 wheeled) Transfers: Sit to/from [facility] Sit to Stand: +2 physical assistance;Min assist Stand pivot transfers: +2 physical assistance;Min assist       General transfer comment: cues for hand placement, elevated height of bed, pt attempting to sit prematurely    Balance Overall balance assessment: Needs assistance   Sitting balance-Leahy Scale: Fair       Standing balance-Leahy Scale: Poor Standing balance comment: sits before aligned with chair                           ADL either performed or assessed with clinical judgement   ADL Overall ADL's : Needs assistance/impaired     Grooming: Brushing hair;Oral care;Sitting;Set up               Lower Body Dressing: Total assistance;Bed level                       Vision       Perception     Praxis      Cognition Arousal/Alertness: Awake/alert Behavior During Therapy: WFL for tasks assessed/performed Overall Cognitive Status: History of cognitive impairments - at baseline                                 General Comments: pt pleasant        Exercises     Shoulder Instructions       General Comments      Pertinent Vitals/ Pain       Pain Assessment: No/denies pain  Home Living                                          Prior Functioning/Environment              Frequency  Min 2X/week        Progress Toward Goals  OT Goals(current goals can now be found in  the care plan section)  Progress towards OT goals: Progressing toward goals  Acute Rehab OT Goals Patient Stated Goal: to go home OT Goal Formulation: With patient Time For Goal Achievement: 03/19/20 Potential to Achieve Goals: Good  Plan Discharge plan remains appropriate    Co-evaluation                 AM-PAC OT "6 Clicks" Daily Activity     Outcome Measure   Help from another person eating meals?: A Little Help from another person taking care of personal grooming?: A Little Help from another person toileting, which includes using toliet, bedpan, or urinal?: Total Help from another person bathing (including washing, rinsing, drying)?: A Lot Help from another person to put on and taking off regular upper body clothing?: A Little Help from another person to put on and taking off regular lower body clothing?: Total 6 Click Score: 13    End of Session Equipment Utilized During Treatment: Rolling walker;Gait belt  OT Visit Diagnosis: Unsteadiness on  feet (R26.81);Other abnormalities of gait and mobility (R26.89);Muscle weakness (generalized) (M62.81);Other symptoms and signs involving cognitive function   Activity Tolerance Patient tolerated treatment well   Patient Left in chair;with call bell/phone within reach;with family/visitor present   Nurse Communication Mobility status        Time: [id number] OT Time Calculation (min): 35 min  Charges: OT General Charges $OT Visit: 1 Visit OT Treatments $Self Care/Home Management : 23-37 mins  Nestor Lewandowsky, OTR/L Acute Rehabilitation Services Pager: [phone] Office: [phone]   Malka So 03/14/2020, 1:11 PM

## 2020-03-14 NOTE — TOC Transition Note (Signed)
Transition of Care [facility]) - CM/SW Discharge Note   Patient Details  Name: Brianna Pineda MRN: [id number] Date of Birth: [birth year]  [facility]) CM/SW Contact:  Angelita Ingles, RN Phone Number: 03/14/2020, 5:08 PM   Clinical Narrative:    Patient to discharge home today. Home O2 has been set up [place] with [employer] made aware. Home health services already in place with [facility]. Patient to be discharged home with husband.CM spoke with bedside nurse and husband at the bedside and both feel that the husband can safely transport patient home via private vehicle. No further needs identified.   Final next level of care: Home/Self Care Barriers to Discharge: No Barriers Identified   Patient Goals and CMS Choice        Discharge Placement                  Name of family member notified: Jeneen Rinks Hernan Patient and family notified of of transfer: 03/14/20  Discharge Plan and Services   Discharge Planning Services: CM Consult            DME Arranged: Oxygen DME Agency: [employer] Date DME Agency Contacted: 03/14/20 Time DME Agency Contacted: 0400 Representative spoke with at DME Agency: [facility] HH Arranged: PT, OT [place] Agency: [place] Date [place]: 03/13/20 Time HH Agency Contacted: 1500 Representative spoke with at [place]: [place] ([place]) Interventions     Readmission Risk Interventions No flowsheet data found.

## 2020-03-14 NOTE — Progress Notes (Signed)
Patient's relative refused PPV tonight.

## 2020-03-14 NOTE — Discharge Summary (Signed)
Brianna Pineda [id number] DOB: 08/30/1932 DOA: 03/09/2020  PCP: Nicoletta Dress, MD  Admit date: 03/09/2020 Discharge date: 03/14/2020  Admitted From: Home   Disposition:  home  Recommendations for Outpatient Follow-up:  1. Follow up with PCP in 1-2 weeks 2. New Medications: eliquis starter pack, 2 L O2  Home Health:yes PT  Equipment/Devices: 2 L O2 Discharge Condition: Stable  CODE STATUS:DNR    Brief/Interim Summary: History of present illness:  Brianna Pineda is a 86 y.o. year old female with medical history significant for dementia who presented on 03/09/2020 as a transfer from [place] due to bilateral saddle PE with RV strain found on CTA chest and admitted to ICU with acute hypoxic respiratory failure requiring upwards of 6 L nasal cannula. Remaining hospital course addressed in problem based format below:   Hospital Course:   Acute hypoxic respiratory failure secondary to bilateral saddle PE , improving Needed upwards of 5 to 6 L nasal cannula supplemental O2 while in ICU.  Was able to wean to 2 L with exertion to maintain appropriate saturations on discharge. DME provided on discharge  Acute bilateral saddle PE with right heart strain and acute left leg DVT Monitored in ICU on heparin drip.  Did not meet criteria for systemic thrombolytic therapy due to age per PCCM assessment.  Found to have extensive left leg DVT on venous duplex. Transitioned to oral eliquis with no issues. Starter pack provided on discharge  Cough/congestion with interstitial edema, likely mild CHFpEF exacerbtion No pneumonia on chest x-ray.   Did get IV Lasix 40 mg x1 with improvement in symptoms and repeat CXR showing resolution of mild interstitial edema in 24 hours (CXR on discharge showed atelectasis) --Continue home lasix but at reduced dose of 20 mg qod -Supportive care with Mucinex, incentive spirometry --home O2   HTN -Home lisinopril  Dementia with behavioral disturbances Required soft  physical restraints while in ICU with episodes of intermittent agitation/pulling IV lines.  Mental status improved with reorientation and transfer from ICU to regular floor. PT recommended skilled nursing but patient's husband declined electing to bring her home with home health therapy -continue Namenda, Exelon, Effexor     Consultations:  PCCM  Procedures/Studies:  TEE, 03/10/2020.  Grade 1 diastolic dysfunction.  RVSP 43.4, right ventricle size is mildly enlarged  Venous duplex, 03/10/2020. Acute DVT of left femoral vein, left popliteal vein, left posterior tibial vein, and left peroneal veins  Subjective: She feels breathing ok. Husband at bedside states slight cough, productive than yesterday but better  Discharge Exam: Vitals:   03/14/20 1250 03/14/20 1502  BP: 135/63   Pulse: 83   Resp: 20   Temp: 98.1 F (36.7 C)   SpO2: 90% 92%   Vitals:   03/14/20 0757 03/14/20 0814 03/14/20 1250 03/14/20 1502  BP:  (!) 137/54 135/63   Pulse:  78 83   Resp:  20 20   Temp:  97.8 F (36.6 C) 98.1 F (36.7 C)   TempSrc:      SpO2: 95% 94% 90% 92%  Weight:      Height:        General: Lying in bed, no apparent distress Eyes: EOMI, anicteric ENT: Oral Mucosa clear and moist Cardiovascular: regular rate and rhythm, no murmurs, rubs or gallops, no edema, Respiratory: Normal respiratory effort on 2L, scant rhonchi with faint wheeze, no crackles Abdomen: soft, non-distended, non-tender, normal bowel sounds Skin: No Rash Neurologic: Grossly no focal neuro deficit.Mental status AAOx3, speech normal,  Psychiatric:Appropriate affect, and mood  Discharge Diagnoses:  Active Problems:   Acute pulmonary embolism [facility])    Discharge Instructions  Discharge Instructions    Diet - low sodium heart healthy   Complete by: As directed    Increase activity slowly   Complete by: As directed      Allergies as of 03/14/2020      Reactions   Other    NO BLOOD OR BLOOD PRODUCTS -  Jehovah's witness      Medication List    TAKE these medications   alendronate 70 MG tablet Commonly known as: FOSAMAX Take 70 mg by mouth every Friday.   Apixaban Starter Pack 5 MG Tbpk Commonly known as: ELIQUIS STARTER PACK Take as directed on package: start with two-5mg  tablets twice daily for 7 days. On day 8, switch to one-5mg  tablet twice daily.   benzonatate 100 MG capsule Commonly known as: TESSALON Take 1 capsule (100 mg total) by mouth 3 (three) times daily as needed for cough.   furosemide 40 MG tablet Commonly known as: LASIX Take 0.5 tablets (20 mg total) by mouth every other day. What changed: how much to take   guaiFENesin 600 MG 12 hr tablet Commonly known as: MUCINEX Take 600 mg by mouth 2 (two) times daily.   lisinopril 20 MG tablet Commonly known as: ZESTRIL Take 20 mg by mouth daily with supper.   memantine 10 MG tablet Commonly known as: NAMENDA Take 10 mg by mouth 2 (two) times daily.   multivitamin with minerals tablet Take 1 tablet by mouth daily with supper.   Myrbetriq 50 MG Tb24 tablet Generic drug: mirabegron ER Take 50 mg by mouth daily with supper.   naproxen sodium 220 MG tablet Commonly known as: ALEVE Take 220-440 mg by mouth 2 (two) times daily as needed (pain).   PRESCRIPTION MEDICATION Inhale into the lungs at bedtime. BIPAP   rivastigmine 9.5 mg/24hr Commonly known as: EXELON Place 9.5 mg onto the skin every evening.   solifenacin 5 MG tablet Commonly known as: VESICARE Take 5 mg by mouth daily with supper.   venlafaxine XR 75 MG 24 hr capsule Commonly known as: EFFEXOR-XR Take 75 mg by mouth daily with supper.   Vitamin D (Ergocalciferol) 1.25 MG (50000 UNIT) Caps capsule Commonly known as: DRISDOL Take 50,000 Units by mouth every Friday.            Durable Medical Equipment  (From admission, onward)         Start     Ordered   03/14/20 1459  For home use only DME oxygen  Once    Question Answer  Comment  Length of Need 6 Months   Mode or (Route) Mask   Liters per Minute 2   Frequency Continuous (stationary and portable oxygen unit needed)   Oxygen delivery system Gas      03/14/20 1458         Follow-up Information    [facility] [place] Follow up.   Specialty: Home Health Services Why: Home Health physical therapy and occupational therapy have been set up with [facility]. Agency will contact you.  Contact information: [place] [address] [phone]          Allergies  Allergen Reactions  . Other     NO BLOOD OR BLOOD PRODUCTS - Jehovah's witness        The results of significant diagnostics from this hospitalization (including imaging, microbiology,  ancillary and laboratory) are listed below for reference.     Microbiology: Recent Results (from the past 240 hour(s))  MRSA PCR Screening     Status: None   Collection Time: 03/09/20  3:40 PM   Specimen: Nasopharyngeal  Result Value Ref Range Status   MRSA by PCR NEGATIVE NEGATIVE Final    Comment:        The GeneXpert MRSA Assay (FDA approved for NASAL specimens only), is one component of a comprehensive MRSA colonization surveillance program. It is not intended to diagnose MRSA infection nor to guide or monitor treatment for MRSA infections. Performed at [facility], [place] [address], [place], [place] [address]      Labs: BNP (last 3 results) Recent Labs    03/10/20 0648  BNP [id number]*   Basic Metabolic Panel: Recent Labs  Lab 03/10/20 0332 03/11/20 0834 03/12/20 0247 03/13/20 0248 03/14/20 0211  NA 139 141 142 138 138  K 4.1 4.4 4.3 3.9 4.3  CL 102 104 104 101 98  CO2 24 25 27 26 27   GLUCOSE 104* 132* 115* 127* 126*  BUN 30* 33* 26* 21 22  CREATININE 1.50* 1.12* 1.04* 1.00 0.96  CALCIUM 9.0 8.9 8.5* 8.4* 8.5*  MG  --  2.5* 2.3 2.3 2.5*  PHOS  --  5.0* 4.0 3.6 3.5   Liver Function Tests: Recent Labs  Lab 03/10/20 0332  03/11/20 0834 03/12/20 0247 03/13/20 0248 03/14/20 0211  AST 18 26 25 22 28   ALT 15 20 19 22 31   ALKPHOS 55 50 51 50 50  BILITOT 1.1 0.9 0.7 0.9 0.4  PROT 6.9 6.8 6.3* 6.4* 6.5  ALBUMIN 3.1* 2.8* 2.6* 2.6* 2.7*   No results for input(s): LIPASE, AMYLASE in the last 168 hours. No results for input(s): AMMONIA in the last 168 hours. CBC: Recent Labs  Lab 03/10/20 0332 03/11/20 0215 03/12/20 0247 03/13/20 0248 03/14/20 0211  WBC 10.8* 10.1 9.2 9.8 13.0*  NEUTROABS  --   --  5.8 6.2 10.2*  HGB 12.3 12.7 12.1 12.4 12.6  HCT 38.0 39.0 38.0 39.2 39.3  MCV 93.1 92.6 94.8 93.3 93.8  PLT 269 302 304 344 370   Cardiac Enzymes: No results for input(s): CKTOTAL, CKMB, CKMBINDEX, TROPONINI in the last 168 hours. BNP: Invalid input(s): POCBNP CBG: Recent Labs  Lab 03/09/20 1935 03/09/20 2337 03/10/20 0340 03/10/20 0720 03/10/20 1112  GLUCAP 110* 107* 98 123* 114*   D-Dimer No results for input(s): DDIMER in the last 72 hours. Hgb A1c No results for input(s): HGBA1C in the last 72 hours. Lipid Profile No results for input(s): CHOL, HDL, LDLCALC, TRIG, CHOLHDL, LDLDIRECT in the last 72 hours. Thyroid function studies No results for input(s): TSH, T4TOTAL, T3FREE, THYROIDAB in the last 72 hours.  Invalid input(s): FREET3 Anemia work up No results for input(s): VITAMINB12, FOLATE, FERRITIN, TIBC, IRON, RETICCTPCT in the last 72 hours. Urinalysis    Component Value Date/Time   COLORURINE AMBER (A) 01/10/2016 1028   APPEARANCEUR CLOUDY (A) 01/10/2016 1028   LABSPEC 1.026 01/10/2016 1028   PHURINE 5.0 01/10/2016 1028   GLUCOSEU NEGATIVE 01/10/2016 1028   HGBUR NEGATIVE 01/10/2016 1028   BILIRUBINUR SMALL (A) 01/10/2016 1028   KETONESUR 15 (A) 01/10/2016 1028   PROTEINUR NEGATIVE 01/10/2016 1028   NITRITE NEGATIVE 01/10/2016 1028   LEUKOCYTESUR SMALL (A) 01/10/2016 1028   Sepsis Labs Invalid input(s): PROCALCITONIN,  WBC,  LACTICIDVEN Microbiology Recent Results  (from the past 240 hour(s))  MRSA PCR  Screening     Status: None   Collection Time: 03/09/20  3:40 PM   Specimen: Nasopharyngeal  Result Value Ref Range Status   MRSA by PCR NEGATIVE NEGATIVE Final    Comment:        The GeneXpert MRSA Assay (FDA approved for NASAL specimens only), is one component of a comprehensive MRSA colonization surveillance program. It is not intended to diagnose MRSA infection nor to guide or monitor treatment for MRSA infections. Performed at [facility], [place] [address], [place], [place] [address]      Time coordinating discharge: Over 30 minutes  SIGNED:   Desiree Hane, MD  [facility] 03/14/2020, 4:46 PM Pager   If 7PM-7AM, please contact night-coverage [email or web address] Password [email or web address]

## 2020-03-15 DIAGNOSIS — Z96659 Presence of unspecified artificial knee joint: Secondary | ICD-10-CM | POA: Diagnosis not present

## 2020-03-15 DIAGNOSIS — J479 Bronchiectasis, uncomplicated: Secondary | ICD-10-CM | POA: Diagnosis not present

## 2020-03-15 DIAGNOSIS — I2699 Other pulmonary embolism without acute cor pulmonale: Secondary | ICD-10-CM | POA: Diagnosis not present

## 2020-03-16 DIAGNOSIS — G8929 Other chronic pain: Secondary | ICD-10-CM | POA: Diagnosis not present

## 2020-03-16 DIAGNOSIS — Z7901 Long term (current) use of anticoagulants: Secondary | ICD-10-CM | POA: Diagnosis not present

## 2020-03-16 DIAGNOSIS — G4733 Obstructive sleep apnea (adult) (pediatric): Secondary | ICD-10-CM | POA: Diagnosis not present

## 2020-03-16 DIAGNOSIS — I82402 Acute embolism and thrombosis of unspecified deep veins of left lower extremity: Secondary | ICD-10-CM | POA: Diagnosis not present

## 2020-03-16 DIAGNOSIS — I2692 Saddle embolus of pulmonary artery without acute cor pulmonale: Secondary | ICD-10-CM | POA: Diagnosis not present

## 2020-03-16 DIAGNOSIS — I110 Hypertensive heart disease with heart failure: Secondary | ICD-10-CM | POA: Diagnosis not present

## 2020-03-16 DIAGNOSIS — R69 Illness, unspecified: Secondary | ICD-10-CM | POA: Diagnosis not present

## 2020-03-16 DIAGNOSIS — I5030 Unspecified diastolic (congestive) heart failure: Secondary | ICD-10-CM | POA: Diagnosis not present

## 2020-03-16 DIAGNOSIS — J9601 Acute respiratory failure with hypoxia: Secondary | ICD-10-CM | POA: Diagnosis not present

## 2020-03-16 DIAGNOSIS — Z9981 Dependence on supplemental oxygen: Secondary | ICD-10-CM | POA: Diagnosis not present

## 2020-03-19 DIAGNOSIS — R69 Illness, unspecified: Secondary | ICD-10-CM | POA: Diagnosis not present

## 2020-03-19 DIAGNOSIS — Z7901 Long term (current) use of anticoagulants: Secondary | ICD-10-CM | POA: Diagnosis not present

## 2020-03-19 DIAGNOSIS — J9601 Acute respiratory failure with hypoxia: Secondary | ICD-10-CM | POA: Diagnosis not present

## 2020-03-19 DIAGNOSIS — I82402 Acute embolism and thrombosis of unspecified deep veins of left lower extremity: Secondary | ICD-10-CM | POA: Diagnosis not present

## 2020-03-19 DIAGNOSIS — I2692 Saddle embolus of pulmonary artery without acute cor pulmonale: Secondary | ICD-10-CM | POA: Diagnosis not present

## 2020-03-19 DIAGNOSIS — I5030 Unspecified diastolic (congestive) heart failure: Secondary | ICD-10-CM | POA: Diagnosis not present

## 2020-03-19 DIAGNOSIS — Z9981 Dependence on supplemental oxygen: Secondary | ICD-10-CM | POA: Diagnosis not present

## 2020-03-19 DIAGNOSIS — G8929 Other chronic pain: Secondary | ICD-10-CM | POA: Diagnosis not present

## 2020-03-19 DIAGNOSIS — I110 Hypertensive heart disease with heart failure: Secondary | ICD-10-CM | POA: Diagnosis not present

## 2020-03-19 DIAGNOSIS — G4733 Obstructive sleep apnea (adult) (pediatric): Secondary | ICD-10-CM | POA: Diagnosis not present

## 2020-03-21 DIAGNOSIS — I2692 Saddle embolus of pulmonary artery without acute cor pulmonale: Secondary | ICD-10-CM | POA: Diagnosis not present

## 2020-03-21 DIAGNOSIS — I50811 Acute right heart failure: Secondary | ICD-10-CM | POA: Diagnosis not present

## 2020-03-21 DIAGNOSIS — I10 Essential (primary) hypertension: Secondary | ICD-10-CM | POA: Diagnosis not present

## 2020-03-21 DIAGNOSIS — J9601 Acute respiratory failure with hypoxia: Secondary | ICD-10-CM | POA: Diagnosis not present

## 2020-03-21 DIAGNOSIS — I82402 Acute embolism and thrombosis of unspecified deep veins of left lower extremity: Secondary | ICD-10-CM | POA: Diagnosis not present

## 2020-03-21 DIAGNOSIS — H6121 Impacted cerumen, right ear: Secondary | ICD-10-CM | POA: Diagnosis not present

## 2020-03-22 DIAGNOSIS — I110 Hypertensive heart disease with heart failure: Secondary | ICD-10-CM | POA: Diagnosis not present

## 2020-03-22 DIAGNOSIS — R69 Illness, unspecified: Secondary | ICD-10-CM | POA: Diagnosis not present

## 2020-03-22 DIAGNOSIS — Z7901 Long term (current) use of anticoagulants: Secondary | ICD-10-CM | POA: Diagnosis not present

## 2020-03-22 DIAGNOSIS — G8929 Other chronic pain: Secondary | ICD-10-CM | POA: Diagnosis not present

## 2020-03-22 DIAGNOSIS — G4733 Obstructive sleep apnea (adult) (pediatric): Secondary | ICD-10-CM | POA: Diagnosis not present

## 2020-03-22 DIAGNOSIS — I5030 Unspecified diastolic (congestive) heart failure: Secondary | ICD-10-CM | POA: Diagnosis not present

## 2020-03-22 DIAGNOSIS — Z9981 Dependence on supplemental oxygen: Secondary | ICD-10-CM | POA: Diagnosis not present

## 2020-03-22 DIAGNOSIS — J9601 Acute respiratory failure with hypoxia: Secondary | ICD-10-CM | POA: Diagnosis not present

## 2020-03-22 DIAGNOSIS — I82402 Acute embolism and thrombosis of unspecified deep veins of left lower extremity: Secondary | ICD-10-CM | POA: Diagnosis not present

## 2020-03-22 DIAGNOSIS — I2692 Saddle embolus of pulmonary artery without acute cor pulmonale: Secondary | ICD-10-CM | POA: Diagnosis not present

## 2020-03-26 DIAGNOSIS — Z7901 Long term (current) use of anticoagulants: Secondary | ICD-10-CM | POA: Diagnosis not present

## 2020-03-26 DIAGNOSIS — Z9981 Dependence on supplemental oxygen: Secondary | ICD-10-CM | POA: Diagnosis not present

## 2020-03-26 DIAGNOSIS — R69 Illness, unspecified: Secondary | ICD-10-CM | POA: Diagnosis not present

## 2020-03-26 DIAGNOSIS — I82402 Acute embolism and thrombosis of unspecified deep veins of left lower extremity: Secondary | ICD-10-CM | POA: Diagnosis not present

## 2020-03-26 DIAGNOSIS — G8929 Other chronic pain: Secondary | ICD-10-CM | POA: Diagnosis not present

## 2020-03-26 DIAGNOSIS — I5030 Unspecified diastolic (congestive) heart failure: Secondary | ICD-10-CM | POA: Diagnosis not present

## 2020-03-26 DIAGNOSIS — J9601 Acute respiratory failure with hypoxia: Secondary | ICD-10-CM | POA: Diagnosis not present

## 2020-03-26 DIAGNOSIS — G4733 Obstructive sleep apnea (adult) (pediatric): Secondary | ICD-10-CM | POA: Diagnosis not present

## 2020-03-26 DIAGNOSIS — I2692 Saddle embolus of pulmonary artery without acute cor pulmonale: Secondary | ICD-10-CM | POA: Diagnosis not present

## 2020-03-26 DIAGNOSIS — I110 Hypertensive heart disease with heart failure: Secondary | ICD-10-CM | POA: Diagnosis not present

## 2020-03-28 DIAGNOSIS — Z9981 Dependence on supplemental oxygen: Secondary | ICD-10-CM | POA: Diagnosis not present

## 2020-03-28 DIAGNOSIS — I82402 Acute embolism and thrombosis of unspecified deep veins of left lower extremity: Secondary | ICD-10-CM | POA: Diagnosis not present

## 2020-03-28 DIAGNOSIS — Z7901 Long term (current) use of anticoagulants: Secondary | ICD-10-CM | POA: Diagnosis not present

## 2020-03-28 DIAGNOSIS — I5030 Unspecified diastolic (congestive) heart failure: Secondary | ICD-10-CM | POA: Diagnosis not present

## 2020-03-28 DIAGNOSIS — I110 Hypertensive heart disease with heart failure: Secondary | ICD-10-CM | POA: Diagnosis not present

## 2020-03-28 DIAGNOSIS — R69 Illness, unspecified: Secondary | ICD-10-CM | POA: Diagnosis not present

## 2020-03-28 DIAGNOSIS — J9601 Acute respiratory failure with hypoxia: Secondary | ICD-10-CM | POA: Diagnosis not present

## 2020-03-28 DIAGNOSIS — G8929 Other chronic pain: Secondary | ICD-10-CM | POA: Diagnosis not present

## 2020-03-28 DIAGNOSIS — I2692 Saddle embolus of pulmonary artery without acute cor pulmonale: Secondary | ICD-10-CM | POA: Diagnosis not present

## 2020-03-28 DIAGNOSIS — G4733 Obstructive sleep apnea (adult) (pediatric): Secondary | ICD-10-CM | POA: Diagnosis not present

## 2020-03-29 DIAGNOSIS — J9601 Acute respiratory failure with hypoxia: Secondary | ICD-10-CM | POA: Diagnosis not present

## 2020-03-29 DIAGNOSIS — I5030 Unspecified diastolic (congestive) heart failure: Secondary | ICD-10-CM | POA: Diagnosis not present

## 2020-03-29 DIAGNOSIS — Z7901 Long term (current) use of anticoagulants: Secondary | ICD-10-CM | POA: Diagnosis not present

## 2020-03-29 DIAGNOSIS — I2692 Saddle embolus of pulmonary artery without acute cor pulmonale: Secondary | ICD-10-CM | POA: Diagnosis not present

## 2020-03-29 DIAGNOSIS — I82402 Acute embolism and thrombosis of unspecified deep veins of left lower extremity: Secondary | ICD-10-CM | POA: Diagnosis not present

## 2020-03-29 DIAGNOSIS — R69 Illness, unspecified: Secondary | ICD-10-CM | POA: Diagnosis not present

## 2020-03-29 DIAGNOSIS — G4733 Obstructive sleep apnea (adult) (pediatric): Secondary | ICD-10-CM | POA: Diagnosis not present

## 2020-03-29 DIAGNOSIS — Z9981 Dependence on supplemental oxygen: Secondary | ICD-10-CM | POA: Diagnosis not present

## 2020-03-29 DIAGNOSIS — I110 Hypertensive heart disease with heart failure: Secondary | ICD-10-CM | POA: Diagnosis not present

## 2020-03-29 DIAGNOSIS — G8929 Other chronic pain: Secondary | ICD-10-CM | POA: Diagnosis not present

## 2020-04-02 DIAGNOSIS — I82402 Acute embolism and thrombosis of unspecified deep veins of left lower extremity: Secondary | ICD-10-CM | POA: Diagnosis not present

## 2020-04-02 DIAGNOSIS — J9601 Acute respiratory failure with hypoxia: Secondary | ICD-10-CM | POA: Diagnosis not present

## 2020-04-02 DIAGNOSIS — I2692 Saddle embolus of pulmonary artery without acute cor pulmonale: Secondary | ICD-10-CM | POA: Diagnosis not present

## 2020-04-02 DIAGNOSIS — G8929 Other chronic pain: Secondary | ICD-10-CM | POA: Diagnosis not present

## 2020-04-02 DIAGNOSIS — I5030 Unspecified diastolic (congestive) heart failure: Secondary | ICD-10-CM | POA: Diagnosis not present

## 2020-04-02 DIAGNOSIS — Z9981 Dependence on supplemental oxygen: Secondary | ICD-10-CM | POA: Diagnosis not present

## 2020-04-02 DIAGNOSIS — Z7901 Long term (current) use of anticoagulants: Secondary | ICD-10-CM | POA: Diagnosis not present

## 2020-04-02 DIAGNOSIS — G4733 Obstructive sleep apnea (adult) (pediatric): Secondary | ICD-10-CM | POA: Diagnosis not present

## 2020-04-02 DIAGNOSIS — R69 Illness, unspecified: Secondary | ICD-10-CM | POA: Diagnosis not present

## 2020-04-02 DIAGNOSIS — I110 Hypertensive heart disease with heart failure: Secondary | ICD-10-CM | POA: Diagnosis not present

## 2020-04-05 DIAGNOSIS — Z7901 Long term (current) use of anticoagulants: Secondary | ICD-10-CM | POA: Diagnosis not present

## 2020-04-05 DIAGNOSIS — R69 Illness, unspecified: Secondary | ICD-10-CM | POA: Diagnosis not present

## 2020-04-05 DIAGNOSIS — I110 Hypertensive heart disease with heart failure: Secondary | ICD-10-CM | POA: Diagnosis not present

## 2020-04-05 DIAGNOSIS — G8929 Other chronic pain: Secondary | ICD-10-CM | POA: Diagnosis not present

## 2020-04-05 DIAGNOSIS — I2692 Saddle embolus of pulmonary artery without acute cor pulmonale: Secondary | ICD-10-CM | POA: Diagnosis not present

## 2020-04-05 DIAGNOSIS — G4733 Obstructive sleep apnea (adult) (pediatric): Secondary | ICD-10-CM | POA: Diagnosis not present

## 2020-04-05 DIAGNOSIS — I5030 Unspecified diastolic (congestive) heart failure: Secondary | ICD-10-CM | POA: Diagnosis not present

## 2020-04-05 DIAGNOSIS — I82402 Acute embolism and thrombosis of unspecified deep veins of left lower extremity: Secondary | ICD-10-CM | POA: Diagnosis not present

## 2020-04-05 DIAGNOSIS — Z9981 Dependence on supplemental oxygen: Secondary | ICD-10-CM | POA: Diagnosis not present

## 2020-04-05 DIAGNOSIS — J9601 Acute respiratory failure with hypoxia: Secondary | ICD-10-CM | POA: Diagnosis not present

## 2020-04-09 DIAGNOSIS — I2692 Saddle embolus of pulmonary artery without acute cor pulmonale: Secondary | ICD-10-CM | POA: Diagnosis not present

## 2020-04-12 DIAGNOSIS — I2692 Saddle embolus of pulmonary artery without acute cor pulmonale: Secondary | ICD-10-CM | POA: Diagnosis not present

## 2020-04-12 DIAGNOSIS — R69 Illness, unspecified: Secondary | ICD-10-CM | POA: Diagnosis not present

## 2020-04-12 DIAGNOSIS — I82402 Acute embolism and thrombosis of unspecified deep veins of left lower extremity: Secondary | ICD-10-CM | POA: Diagnosis not present

## 2020-04-12 DIAGNOSIS — I110 Hypertensive heart disease with heart failure: Secondary | ICD-10-CM | POA: Diagnosis not present

## 2020-04-12 DIAGNOSIS — Z7901 Long term (current) use of anticoagulants: Secondary | ICD-10-CM | POA: Diagnosis not present

## 2020-04-12 DIAGNOSIS — J9601 Acute respiratory failure with hypoxia: Secondary | ICD-10-CM | POA: Diagnosis not present

## 2020-04-12 DIAGNOSIS — G4733 Obstructive sleep apnea (adult) (pediatric): Secondary | ICD-10-CM | POA: Diagnosis not present

## 2020-04-12 DIAGNOSIS — Z9981 Dependence on supplemental oxygen: Secondary | ICD-10-CM | POA: Diagnosis not present

## 2020-04-12 DIAGNOSIS — G8929 Other chronic pain: Secondary | ICD-10-CM | POA: Diagnosis not present

## 2020-04-12 DIAGNOSIS — I5030 Unspecified diastolic (congestive) heart failure: Secondary | ICD-10-CM | POA: Diagnosis not present

## 2020-04-13 DIAGNOSIS — R262 Difficulty in walking, not elsewhere classified: Secondary | ICD-10-CM | POA: Diagnosis not present

## 2020-04-13 DIAGNOSIS — I5030 Unspecified diastolic (congestive) heart failure: Secondary | ICD-10-CM | POA: Diagnosis not present

## 2020-04-13 DIAGNOSIS — Z9981 Dependence on supplemental oxygen: Secondary | ICD-10-CM | POA: Diagnosis not present

## 2020-04-13 DIAGNOSIS — G8929 Other chronic pain: Secondary | ICD-10-CM | POA: Diagnosis not present

## 2020-04-13 DIAGNOSIS — I2692 Saddle embolus of pulmonary artery without acute cor pulmonale: Secondary | ICD-10-CM | POA: Diagnosis not present

## 2020-04-13 DIAGNOSIS — I82402 Acute embolism and thrombosis of unspecified deep veins of left lower extremity: Secondary | ICD-10-CM | POA: Diagnosis not present

## 2020-04-13 DIAGNOSIS — J9601 Acute respiratory failure with hypoxia: Secondary | ICD-10-CM | POA: Diagnosis not present

## 2020-04-13 DIAGNOSIS — R69 Illness, unspecified: Secondary | ICD-10-CM | POA: Diagnosis not present

## 2020-04-13 DIAGNOSIS — Z7901 Long term (current) use of anticoagulants: Secondary | ICD-10-CM | POA: Diagnosis not present

## 2020-04-13 DIAGNOSIS — I110 Hypertensive heart disease with heart failure: Secondary | ICD-10-CM | POA: Diagnosis not present

## 2020-04-13 DIAGNOSIS — G4733 Obstructive sleep apnea (adult) (pediatric): Secondary | ICD-10-CM | POA: Diagnosis not present

## 2020-04-14 DIAGNOSIS — J479 Bronchiectasis, uncomplicated: Secondary | ICD-10-CM | POA: Diagnosis not present

## 2020-04-14 DIAGNOSIS — I2699 Other pulmonary embolism without acute cor pulmonale: Secondary | ICD-10-CM | POA: Diagnosis not present

## 2020-04-14 DIAGNOSIS — Z96659 Presence of unspecified artificial knee joint: Secondary | ICD-10-CM | POA: Diagnosis not present

## 2020-04-18 DIAGNOSIS — I110 Hypertensive heart disease with heart failure: Secondary | ICD-10-CM | POA: Diagnosis not present

## 2020-04-18 DIAGNOSIS — I2692 Saddle embolus of pulmonary artery without acute cor pulmonale: Secondary | ICD-10-CM | POA: Diagnosis not present

## 2020-04-18 DIAGNOSIS — G4733 Obstructive sleep apnea (adult) (pediatric): Secondary | ICD-10-CM | POA: Diagnosis not present

## 2020-04-18 DIAGNOSIS — G8929 Other chronic pain: Secondary | ICD-10-CM | POA: Diagnosis not present

## 2020-04-18 DIAGNOSIS — I5030 Unspecified diastolic (congestive) heart failure: Secondary | ICD-10-CM | POA: Diagnosis not present

## 2020-04-18 DIAGNOSIS — J9601 Acute respiratory failure with hypoxia: Secondary | ICD-10-CM | POA: Diagnosis not present

## 2020-04-18 DIAGNOSIS — R69 Illness, unspecified: Secondary | ICD-10-CM | POA: Diagnosis not present

## 2020-04-18 DIAGNOSIS — Z9981 Dependence on supplemental oxygen: Secondary | ICD-10-CM | POA: Diagnosis not present

## 2020-04-18 DIAGNOSIS — Z7901 Long term (current) use of anticoagulants: Secondary | ICD-10-CM | POA: Diagnosis not present

## 2020-04-18 DIAGNOSIS — I82402 Acute embolism and thrombosis of unspecified deep veins of left lower extremity: Secondary | ICD-10-CM | POA: Diagnosis not present

## 2020-04-25 DIAGNOSIS — Z7901 Long term (current) use of anticoagulants: Secondary | ICD-10-CM | POA: Diagnosis not present

## 2020-04-25 DIAGNOSIS — I2692 Saddle embolus of pulmonary artery without acute cor pulmonale: Secondary | ICD-10-CM | POA: Diagnosis not present

## 2020-04-25 DIAGNOSIS — Z9981 Dependence on supplemental oxygen: Secondary | ICD-10-CM | POA: Diagnosis not present

## 2020-04-25 DIAGNOSIS — R69 Illness, unspecified: Secondary | ICD-10-CM | POA: Diagnosis not present

## 2020-04-25 DIAGNOSIS — G4733 Obstructive sleep apnea (adult) (pediatric): Secondary | ICD-10-CM | POA: Diagnosis not present

## 2020-04-25 DIAGNOSIS — I82402 Acute embolism and thrombosis of unspecified deep veins of left lower extremity: Secondary | ICD-10-CM | POA: Diagnosis not present

## 2020-04-25 DIAGNOSIS — I5030 Unspecified diastolic (congestive) heart failure: Secondary | ICD-10-CM | POA: Diagnosis not present

## 2020-04-25 DIAGNOSIS — G8929 Other chronic pain: Secondary | ICD-10-CM | POA: Diagnosis not present

## 2020-04-25 DIAGNOSIS — J9601 Acute respiratory failure with hypoxia: Secondary | ICD-10-CM | POA: Diagnosis not present

## 2020-04-25 DIAGNOSIS — I110 Hypertensive heart disease with heart failure: Secondary | ICD-10-CM | POA: Diagnosis not present

## 2020-05-03 DIAGNOSIS — Z1331 Encounter for screening for depression: Secondary | ICD-10-CM | POA: Diagnosis not present

## 2020-05-03 DIAGNOSIS — Z0000 Encounter for general adult medical examination without abnormal findings: Secondary | ICD-10-CM | POA: Diagnosis not present

## 2020-05-03 DIAGNOSIS — Z9181 History of falling: Secondary | ICD-10-CM | POA: Diagnosis not present

## 2020-05-03 DIAGNOSIS — Z6839 Body mass index (BMI) 39.0-39.9, adult: Secondary | ICD-10-CM | POA: Diagnosis not present

## 2020-05-03 DIAGNOSIS — E785 Hyperlipidemia, unspecified: Secondary | ICD-10-CM | POA: Diagnosis not present

## 2020-05-10 DIAGNOSIS — I2692 Saddle embolus of pulmonary artery without acute cor pulmonale: Secondary | ICD-10-CM | POA: Diagnosis not present

## 2020-05-10 DIAGNOSIS — E785 Hyperlipidemia, unspecified: Secondary | ICD-10-CM | POA: Diagnosis not present

## 2020-05-10 DIAGNOSIS — E559 Vitamin D deficiency, unspecified: Secondary | ICD-10-CM | POA: Diagnosis not present

## 2020-05-10 DIAGNOSIS — J9601 Acute respiratory failure with hypoxia: Secondary | ICD-10-CM | POA: Diagnosis not present

## 2020-05-10 DIAGNOSIS — R7301 Impaired fasting glucose: Secondary | ICD-10-CM | POA: Diagnosis not present

## 2020-05-10 DIAGNOSIS — G309 Alzheimer's disease, unspecified: Secondary | ICD-10-CM | POA: Diagnosis not present

## 2020-05-10 DIAGNOSIS — R69 Illness, unspecified: Secondary | ICD-10-CM | POA: Diagnosis not present

## 2020-05-10 DIAGNOSIS — I82402 Acute embolism and thrombosis of unspecified deep veins of left lower extremity: Secondary | ICD-10-CM | POA: Diagnosis not present

## 2020-05-10 DIAGNOSIS — I10 Essential (primary) hypertension: Secondary | ICD-10-CM | POA: Diagnosis not present

## 2020-05-14 DIAGNOSIS — R262 Difficulty in walking, not elsewhere classified: Secondary | ICD-10-CM | POA: Diagnosis not present

## 2020-05-15 DIAGNOSIS — Z96659 Presence of unspecified artificial knee joint: Secondary | ICD-10-CM | POA: Diagnosis not present

## 2020-05-15 DIAGNOSIS — I2699 Other pulmonary embolism without acute cor pulmonale: Secondary | ICD-10-CM | POA: Diagnosis not present

## 2020-05-15 DIAGNOSIS — J479 Bronchiectasis, uncomplicated: Secondary | ICD-10-CM | POA: Diagnosis not present

## 2020-06-06 DIAGNOSIS — G4733 Obstructive sleep apnea (adult) (pediatric): Secondary | ICD-10-CM | POA: Diagnosis not present

## 2020-06-06 DIAGNOSIS — J301 Allergic rhinitis due to pollen: Secondary | ICD-10-CM | POA: Diagnosis not present

## 2020-06-06 DIAGNOSIS — R5383 Other fatigue: Secondary | ICD-10-CM | POA: Diagnosis not present

## 2020-06-13 DIAGNOSIS — R262 Difficulty in walking, not elsewhere classified: Secondary | ICD-10-CM | POA: Diagnosis not present

## 2020-06-14 DIAGNOSIS — J479 Bronchiectasis, uncomplicated: Secondary | ICD-10-CM | POA: Diagnosis not present

## 2020-06-14 DIAGNOSIS — Z96659 Presence of unspecified artificial knee joint: Secondary | ICD-10-CM | POA: Diagnosis not present

## 2020-06-14 DIAGNOSIS — I2699 Other pulmonary embolism without acute cor pulmonale: Secondary | ICD-10-CM | POA: Diagnosis not present

## 2020-06-15 DIAGNOSIS — G309 Alzheimer's disease, unspecified: Secondary | ICD-10-CM | POA: Diagnosis not present

## 2020-06-15 DIAGNOSIS — R32 Unspecified urinary incontinence: Secondary | ICD-10-CM | POA: Diagnosis not present

## 2020-06-15 DIAGNOSIS — N952 Postmenopausal atrophic vaginitis: Secondary | ICD-10-CM | POA: Diagnosis not present

## 2020-06-15 DIAGNOSIS — R69 Illness, unspecified: Secondary | ICD-10-CM | POA: Diagnosis not present

## 2020-06-20 DIAGNOSIS — R69 Illness, unspecified: Secondary | ICD-10-CM | POA: Diagnosis not present

## 2020-06-25 DIAGNOSIS — N390 Urinary tract infection, site not specified: Secondary | ICD-10-CM | POA: Diagnosis not present

## 2020-07-10 DIAGNOSIS — M7989 Other specified soft tissue disorders: Secondary | ICD-10-CM | POA: Diagnosis not present

## 2020-07-10 DIAGNOSIS — M79605 Pain in left leg: Secondary | ICD-10-CM | POA: Diagnosis not present

## 2020-07-10 DIAGNOSIS — Z86711 Personal history of pulmonary embolism: Secondary | ICD-10-CM | POA: Diagnosis not present

## 2020-07-10 DIAGNOSIS — R600 Localized edema: Secondary | ICD-10-CM | POA: Diagnosis not present

## 2020-07-12 DIAGNOSIS — M79675 Pain in left toe(s): Secondary | ICD-10-CM | POA: Diagnosis not present

## 2020-07-12 DIAGNOSIS — M79674 Pain in right toe(s): Secondary | ICD-10-CM | POA: Diagnosis not present

## 2020-07-12 DIAGNOSIS — B351 Tinea unguium: Secondary | ICD-10-CM | POA: Diagnosis not present

## 2020-07-14 DIAGNOSIS — R262 Difficulty in walking, not elsewhere classified: Secondary | ICD-10-CM | POA: Diagnosis not present

## 2020-07-15 DIAGNOSIS — J479 Bronchiectasis, uncomplicated: Secondary | ICD-10-CM | POA: Diagnosis not present

## 2020-07-15 DIAGNOSIS — Z96659 Presence of unspecified artificial knee joint: Secondary | ICD-10-CM | POA: Diagnosis not present

## 2020-07-15 DIAGNOSIS — I2699 Other pulmonary embolism without acute cor pulmonale: Secondary | ICD-10-CM | POA: Diagnosis not present

## 2020-07-17 DIAGNOSIS — H524 Presbyopia: Secondary | ICD-10-CM | POA: Diagnosis not present

## 2020-07-26 DIAGNOSIS — Z0100 Encounter for examination of eyes and vision without abnormal findings: Secondary | ICD-10-CM | POA: Diagnosis not present

## 2020-08-10 DIAGNOSIS — E785 Hyperlipidemia, unspecified: Secondary | ICD-10-CM | POA: Diagnosis not present

## 2020-08-10 DIAGNOSIS — R7301 Impaired fasting glucose: Secondary | ICD-10-CM | POA: Diagnosis not present

## 2020-08-10 DIAGNOSIS — E559 Vitamin D deficiency, unspecified: Secondary | ICD-10-CM | POA: Diagnosis not present

## 2020-08-10 DIAGNOSIS — Z86711 Personal history of pulmonary embolism: Secondary | ICD-10-CM | POA: Diagnosis not present

## 2020-08-10 DIAGNOSIS — I10 Essential (primary) hypertension: Secondary | ICD-10-CM | POA: Diagnosis not present

## 2020-08-10 DIAGNOSIS — G309 Alzheimer's disease, unspecified: Secondary | ICD-10-CM | POA: Diagnosis not present

## 2020-08-10 DIAGNOSIS — M79605 Pain in left leg: Secondary | ICD-10-CM | POA: Diagnosis not present

## 2020-08-10 DIAGNOSIS — R69 Illness, unspecified: Secondary | ICD-10-CM | POA: Diagnosis not present

## 2020-08-10 DIAGNOSIS — Z139 Encounter for screening, unspecified: Secondary | ICD-10-CM | POA: Diagnosis not present

## 2020-08-14 DIAGNOSIS — R262 Difficulty in walking, not elsewhere classified: Secondary | ICD-10-CM | POA: Diagnosis not present

## 2020-08-15 DIAGNOSIS — Z96659 Presence of unspecified artificial knee joint: Secondary | ICD-10-CM | POA: Diagnosis not present

## 2020-08-15 DIAGNOSIS — I2699 Other pulmonary embolism without acute cor pulmonale: Secondary | ICD-10-CM | POA: Diagnosis not present

## 2020-08-15 DIAGNOSIS — J479 Bronchiectasis, uncomplicated: Secondary | ICD-10-CM | POA: Diagnosis not present

## 2020-09-13 DIAGNOSIS — R262 Difficulty in walking, not elsewhere classified: Secondary | ICD-10-CM | POA: Diagnosis not present

## 2020-10-14 DIAGNOSIS — R262 Difficulty in walking, not elsewhere classified: Secondary | ICD-10-CM | POA: Diagnosis not present

## 2020-10-16 DIAGNOSIS — R32 Unspecified urinary incontinence: Secondary | ICD-10-CM | POA: Diagnosis not present

## 2020-10-16 DIAGNOSIS — N952 Postmenopausal atrophic vaginitis: Secondary | ICD-10-CM | POA: Diagnosis not present

## 2020-10-16 DIAGNOSIS — G309 Alzheimer's disease, unspecified: Secondary | ICD-10-CM | POA: Diagnosis not present

## 2020-10-16 DIAGNOSIS — R69 Illness, unspecified: Secondary | ICD-10-CM | POA: Diagnosis not present

## 2020-11-12 DIAGNOSIS — I10 Essential (primary) hypertension: Secondary | ICD-10-CM | POA: Diagnosis not present

## 2020-11-12 DIAGNOSIS — Z86711 Personal history of pulmonary embolism: Secondary | ICD-10-CM | POA: Diagnosis not present

## 2020-11-12 DIAGNOSIS — E785 Hyperlipidemia, unspecified: Secondary | ICD-10-CM | POA: Diagnosis not present

## 2020-11-12 DIAGNOSIS — Z23 Encounter for immunization: Secondary | ICD-10-CM | POA: Diagnosis not present

## 2020-11-12 DIAGNOSIS — G309 Alzheimer's disease, unspecified: Secondary | ICD-10-CM | POA: Diagnosis not present

## 2020-11-12 DIAGNOSIS — R7301 Impaired fasting glucose: Secondary | ICD-10-CM | POA: Diagnosis not present

## 2020-11-12 DIAGNOSIS — E559 Vitamin D deficiency, unspecified: Secondary | ICD-10-CM | POA: Diagnosis not present

## 2020-11-12 DIAGNOSIS — R69 Illness, unspecified: Secondary | ICD-10-CM | POA: Diagnosis not present

## 2020-11-12 DIAGNOSIS — M79605 Pain in left leg: Secondary | ICD-10-CM | POA: Diagnosis not present

## 2020-11-13 DIAGNOSIS — R262 Difficulty in walking, not elsewhere classified: Secondary | ICD-10-CM | POA: Diagnosis not present

## 2020-11-14 DIAGNOSIS — Z86718 Personal history of other venous thrombosis and embolism: Secondary | ICD-10-CM | POA: Diagnosis not present

## 2020-11-14 DIAGNOSIS — Z86711 Personal history of pulmonary embolism: Secondary | ICD-10-CM | POA: Diagnosis not present

## 2020-11-14 DIAGNOSIS — M79604 Pain in right leg: Secondary | ICD-10-CM | POA: Diagnosis not present

## 2020-11-14 DIAGNOSIS — M5410 Radiculopathy, site unspecified: Secondary | ICD-10-CM | POA: Diagnosis not present

## 2020-11-14 DIAGNOSIS — M7122 Synovial cyst of popliteal space [Baker], left knee: Secondary | ICD-10-CM | POA: Diagnosis not present

## 2020-11-21 DIAGNOSIS — J301 Allergic rhinitis due to pollen: Secondary | ICD-10-CM | POA: Diagnosis not present

## 2020-11-21 DIAGNOSIS — R5383 Other fatigue: Secondary | ICD-10-CM | POA: Diagnosis not present

## 2020-11-21 DIAGNOSIS — G4733 Obstructive sleep apnea (adult) (pediatric): Secondary | ICD-10-CM | POA: Diagnosis not present

## 2020-11-22 DIAGNOSIS — G4733 Obstructive sleep apnea (adult) (pediatric): Secondary | ICD-10-CM | POA: Diagnosis not present

## 2020-12-04 DIAGNOSIS — G309 Alzheimer's disease, unspecified: Secondary | ICD-10-CM | POA: Diagnosis not present

## 2020-12-04 DIAGNOSIS — R69 Illness, unspecified: Secondary | ICD-10-CM | POA: Diagnosis not present

## 2020-12-04 DIAGNOSIS — N952 Postmenopausal atrophic vaginitis: Secondary | ICD-10-CM | POA: Diagnosis not present

## 2020-12-04 DIAGNOSIS — N390 Urinary tract infection, site not specified: Secondary | ICD-10-CM | POA: Diagnosis not present

## 2020-12-04 DIAGNOSIS — R32 Unspecified urinary incontinence: Secondary | ICD-10-CM | POA: Diagnosis not present

## 2020-12-14 DIAGNOSIS — R262 Difficulty in walking, not elsewhere classified: Secondary | ICD-10-CM | POA: Diagnosis not present

## 2021-01-14 DIAGNOSIS — R262 Difficulty in walking, not elsewhere classified: Secondary | ICD-10-CM | POA: Diagnosis not present

## 2021-02-11 DIAGNOSIS — R69 Illness, unspecified: Secondary | ICD-10-CM | POA: Diagnosis not present

## 2021-02-11 DIAGNOSIS — Z86711 Personal history of pulmonary embolism: Secondary | ICD-10-CM | POA: Diagnosis not present

## 2021-02-11 DIAGNOSIS — R7301 Impaired fasting glucose: Secondary | ICD-10-CM | POA: Diagnosis not present

## 2021-02-11 DIAGNOSIS — R262 Difficulty in walking, not elsewhere classified: Secondary | ICD-10-CM | POA: Diagnosis not present

## 2021-02-11 DIAGNOSIS — I10 Essential (primary) hypertension: Secondary | ICD-10-CM | POA: Diagnosis not present

## 2021-02-11 DIAGNOSIS — E559 Vitamin D deficiency, unspecified: Secondary | ICD-10-CM | POA: Diagnosis not present

## 2021-02-11 DIAGNOSIS — G309 Alzheimer's disease, unspecified: Secondary | ICD-10-CM | POA: Diagnosis not present

## 2021-02-11 DIAGNOSIS — E785 Hyperlipidemia, unspecified: Secondary | ICD-10-CM | POA: Diagnosis not present

## 2021-03-04 DIAGNOSIS — N952 Postmenopausal atrophic vaginitis: Secondary | ICD-10-CM | POA: Diagnosis not present

## 2021-03-04 DIAGNOSIS — N390 Urinary tract infection, site not specified: Secondary | ICD-10-CM | POA: Diagnosis not present

## 2021-03-04 DIAGNOSIS — R32 Unspecified urinary incontinence: Secondary | ICD-10-CM | POA: Diagnosis not present

## 2021-03-04 DIAGNOSIS — G309 Alzheimer's disease, unspecified: Secondary | ICD-10-CM | POA: Diagnosis not present

## 2021-03-04 DIAGNOSIS — R69 Illness, unspecified: Secondary | ICD-10-CM | POA: Diagnosis not present

## 2021-03-14 DIAGNOSIS — R262 Difficulty in walking, not elsewhere classified: Secondary | ICD-10-CM | POA: Diagnosis not present

## 2021-04-13 DIAGNOSIS — R262 Difficulty in walking, not elsewhere classified: Secondary | ICD-10-CM | POA: Diagnosis not present

## 2021-05-07 DIAGNOSIS — Z9181 History of falling: Secondary | ICD-10-CM | POA: Diagnosis not present

## 2021-05-07 DIAGNOSIS — E785 Hyperlipidemia, unspecified: Secondary | ICD-10-CM | POA: Diagnosis not present

## 2021-05-07 DIAGNOSIS — Z0000 Encounter for general adult medical examination without abnormal findings: Secondary | ICD-10-CM | POA: Diagnosis not present

## 2021-05-07 DIAGNOSIS — Z1331 Encounter for screening for depression: Secondary | ICD-10-CM | POA: Diagnosis not present

## 2021-05-07 DIAGNOSIS — E669 Obesity, unspecified: Secondary | ICD-10-CM | POA: Diagnosis not present

## 2021-05-14 DIAGNOSIS — R262 Difficulty in walking, not elsewhere classified: Secondary | ICD-10-CM | POA: Diagnosis not present

## 2021-06-08 DIAGNOSIS — I10 Essential (primary) hypertension: Secondary | ICD-10-CM | POA: Diagnosis not present

## 2021-06-08 DIAGNOSIS — E559 Vitamin D deficiency, unspecified: Secondary | ICD-10-CM | POA: Diagnosis not present

## 2021-06-08 DIAGNOSIS — I4891 Unspecified atrial fibrillation: Secondary | ICD-10-CM | POA: Diagnosis not present

## 2021-06-08 DIAGNOSIS — G4733 Obstructive sleep apnea (adult) (pediatric): Secondary | ICD-10-CM | POA: Diagnosis not present

## 2021-06-08 DIAGNOSIS — G309 Alzheimer's disease, unspecified: Secondary | ICD-10-CM | POA: Diagnosis not present

## 2021-06-08 DIAGNOSIS — Z6841 Body Mass Index (BMI) 40.0 and over, adult: Secondary | ICD-10-CM | POA: Diagnosis not present

## 2021-06-08 DIAGNOSIS — D6869 Other thrombophilia: Secondary | ICD-10-CM | POA: Diagnosis not present

## 2021-06-08 DIAGNOSIS — R69 Illness, unspecified: Secondary | ICD-10-CM | POA: Diagnosis not present

## 2021-06-08 DIAGNOSIS — Z008 Encounter for other general examination: Secondary | ICD-10-CM | POA: Diagnosis not present

## 2021-07-04 DIAGNOSIS — N952 Postmenopausal atrophic vaginitis: Secondary | ICD-10-CM | POA: Diagnosis not present

## 2021-07-04 DIAGNOSIS — N390 Urinary tract infection, site not specified: Secondary | ICD-10-CM | POA: Diagnosis not present

## 2021-07-04 DIAGNOSIS — R32 Unspecified urinary incontinence: Secondary | ICD-10-CM | POA: Diagnosis not present

## 2021-07-04 DIAGNOSIS — R69 Illness, unspecified: Secondary | ICD-10-CM | POA: Diagnosis not present

## 2021-07-04 DIAGNOSIS — G309 Alzheimer's disease, unspecified: Secondary | ICD-10-CM | POA: Diagnosis not present

## 2021-07-05 DIAGNOSIS — R32 Unspecified urinary incontinence: Secondary | ICD-10-CM | POA: Diagnosis not present

## 2021-07-27 DIAGNOSIS — R0781 Pleurodynia: Secondary | ICD-10-CM | POA: Diagnosis not present

## 2021-07-27 DIAGNOSIS — R198 Other specified symptoms and signs involving the digestive system and abdomen: Secondary | ICD-10-CM | POA: Diagnosis not present

## 2021-07-27 DIAGNOSIS — Z6841 Body Mass Index (BMI) 40.0 and over, adult: Secondary | ICD-10-CM | POA: Diagnosis not present

## 2021-07-29 DIAGNOSIS — R0781 Pleurodynia: Secondary | ICD-10-CM | POA: Diagnosis not present

## 2021-07-29 DIAGNOSIS — R198 Other specified symptoms and signs involving the digestive system and abdomen: Secondary | ICD-10-CM | POA: Diagnosis not present

## 2021-07-29 DIAGNOSIS — R109 Unspecified abdominal pain: Secondary | ICD-10-CM | POA: Diagnosis not present

## 2021-08-09 DIAGNOSIS — G4733 Obstructive sleep apnea (adult) (pediatric): Secondary | ICD-10-CM | POA: Diagnosis not present

## 2021-08-12 DIAGNOSIS — E559 Vitamin D deficiency, unspecified: Secondary | ICD-10-CM | POA: Diagnosis not present

## 2021-08-12 DIAGNOSIS — M8589 Other specified disorders of bone density and structure, multiple sites: Secondary | ICD-10-CM | POA: Diagnosis not present

## 2021-08-12 DIAGNOSIS — G309 Alzheimer's disease, unspecified: Secondary | ICD-10-CM | POA: Diagnosis not present

## 2021-08-12 DIAGNOSIS — R7301 Impaired fasting glucose: Secondary | ICD-10-CM | POA: Diagnosis not present

## 2021-08-12 DIAGNOSIS — E785 Hyperlipidemia, unspecified: Secondary | ICD-10-CM | POA: Diagnosis not present

## 2021-08-12 DIAGNOSIS — R198 Other specified symptoms and signs involving the digestive system and abdomen: Secondary | ICD-10-CM | POA: Diagnosis not present

## 2021-08-12 DIAGNOSIS — Z86711 Personal history of pulmonary embolism: Secondary | ICD-10-CM | POA: Diagnosis not present

## 2021-08-12 DIAGNOSIS — R69 Illness, unspecified: Secondary | ICD-10-CM | POA: Diagnosis not present

## 2021-08-12 DIAGNOSIS — I10 Essential (primary) hypertension: Secondary | ICD-10-CM | POA: Diagnosis not present

## 2021-08-13 DIAGNOSIS — Z0100 Encounter for examination of eyes and vision without abnormal findings: Secondary | ICD-10-CM | POA: Diagnosis not present

## 2021-09-04 DIAGNOSIS — R32 Unspecified urinary incontinence: Secondary | ICD-10-CM | POA: Diagnosis not present

## 2021-09-04 DIAGNOSIS — N952 Postmenopausal atrophic vaginitis: Secondary | ICD-10-CM | POA: Diagnosis not present

## 2021-09-04 DIAGNOSIS — R69 Illness, unspecified: Secondary | ICD-10-CM | POA: Diagnosis not present

## 2021-09-04 DIAGNOSIS — G309 Alzheimer's disease, unspecified: Secondary | ICD-10-CM | POA: Diagnosis not present

## 2021-09-04 DIAGNOSIS — N390 Urinary tract infection, site not specified: Secondary | ICD-10-CM | POA: Diagnosis not present

## 2021-12-05 DIAGNOSIS — R32 Unspecified urinary incontinence: Secondary | ICD-10-CM | POA: Diagnosis not present

## 2021-12-05 DIAGNOSIS — N390 Urinary tract infection, site not specified: Secondary | ICD-10-CM | POA: Diagnosis not present

## 2021-12-05 DIAGNOSIS — G309 Alzheimer's disease, unspecified: Secondary | ICD-10-CM | POA: Diagnosis not present

## 2021-12-05 DIAGNOSIS — R69 Illness, unspecified: Secondary | ICD-10-CM | POA: Diagnosis not present

## 2021-12-05 DIAGNOSIS — N952 Postmenopausal atrophic vaginitis: Secondary | ICD-10-CM | POA: Diagnosis not present

## 2021-12-10 DIAGNOSIS — N952 Postmenopausal atrophic vaginitis: Secondary | ICD-10-CM | POA: Diagnosis not present

## 2021-12-10 DIAGNOSIS — Z87440 Personal history of urinary (tract) infections: Secondary | ICD-10-CM | POA: Diagnosis not present

## 2021-12-10 DIAGNOSIS — I10 Essential (primary) hypertension: Secondary | ICD-10-CM | POA: Diagnosis not present

## 2021-12-10 DIAGNOSIS — M859 Disorder of bone density and structure, unspecified: Secondary | ICD-10-CM | POA: Diagnosis not present

## 2021-12-10 DIAGNOSIS — M1711 Unilateral primary osteoarthritis, right knee: Secondary | ICD-10-CM | POA: Diagnosis not present

## 2021-12-10 DIAGNOSIS — G309 Alzheimer's disease, unspecified: Secondary | ICD-10-CM | POA: Diagnosis not present

## 2021-12-10 DIAGNOSIS — E559 Vitamin D deficiency, unspecified: Secondary | ICD-10-CM | POA: Diagnosis not present

## 2021-12-10 DIAGNOSIS — R32 Unspecified urinary incontinence: Secondary | ICD-10-CM | POA: Diagnosis not present

## 2021-12-10 DIAGNOSIS — R69 Illness, unspecified: Secondary | ICD-10-CM | POA: Diagnosis not present

## 2021-12-12 DIAGNOSIS — I10 Essential (primary) hypertension: Secondary | ICD-10-CM | POA: Diagnosis not present

## 2021-12-12 DIAGNOSIS — Z87440 Personal history of urinary (tract) infections: Secondary | ICD-10-CM | POA: Diagnosis not present

## 2021-12-12 DIAGNOSIS — R32 Unspecified urinary incontinence: Secondary | ICD-10-CM | POA: Diagnosis not present

## 2021-12-12 DIAGNOSIS — M859 Disorder of bone density and structure, unspecified: Secondary | ICD-10-CM | POA: Diagnosis not present

## 2021-12-12 DIAGNOSIS — R69 Illness, unspecified: Secondary | ICD-10-CM | POA: Diagnosis not present

## 2021-12-12 DIAGNOSIS — G309 Alzheimer's disease, unspecified: Secondary | ICD-10-CM | POA: Diagnosis not present

## 2021-12-12 DIAGNOSIS — M1711 Unilateral primary osteoarthritis, right knee: Secondary | ICD-10-CM | POA: Diagnosis not present

## 2021-12-12 DIAGNOSIS — N952 Postmenopausal atrophic vaginitis: Secondary | ICD-10-CM | POA: Diagnosis not present

## 2021-12-12 DIAGNOSIS — E559 Vitamin D deficiency, unspecified: Secondary | ICD-10-CM | POA: Diagnosis not present

## 2021-12-14 DIAGNOSIS — R262 Difficulty in walking, not elsewhere classified: Secondary | ICD-10-CM | POA: Diagnosis not present

## 2021-12-17 DIAGNOSIS — G309 Alzheimer's disease, unspecified: Secondary | ICD-10-CM | POA: Diagnosis not present

## 2021-12-17 DIAGNOSIS — I10 Essential (primary) hypertension: Secondary | ICD-10-CM | POA: Diagnosis not present

## 2021-12-17 DIAGNOSIS — M1711 Unilateral primary osteoarthritis, right knee: Secondary | ICD-10-CM | POA: Diagnosis not present

## 2021-12-17 DIAGNOSIS — R69 Illness, unspecified: Secondary | ICD-10-CM | POA: Diagnosis not present

## 2021-12-17 DIAGNOSIS — N952 Postmenopausal atrophic vaginitis: Secondary | ICD-10-CM | POA: Diagnosis not present

## 2021-12-17 DIAGNOSIS — Z87440 Personal history of urinary (tract) infections: Secondary | ICD-10-CM | POA: Diagnosis not present

## 2021-12-17 DIAGNOSIS — M859 Disorder of bone density and structure, unspecified: Secondary | ICD-10-CM | POA: Diagnosis not present

## 2021-12-17 DIAGNOSIS — R32 Unspecified urinary incontinence: Secondary | ICD-10-CM | POA: Diagnosis not present

## 2021-12-17 DIAGNOSIS — E559 Vitamin D deficiency, unspecified: Secondary | ICD-10-CM | POA: Diagnosis not present

## 2021-12-18 DIAGNOSIS — R32 Unspecified urinary incontinence: Secondary | ICD-10-CM | POA: Diagnosis not present

## 2021-12-18 DIAGNOSIS — G309 Alzheimer's disease, unspecified: Secondary | ICD-10-CM | POA: Diagnosis not present

## 2021-12-18 DIAGNOSIS — Z87440 Personal history of urinary (tract) infections: Secondary | ICD-10-CM | POA: Diagnosis not present

## 2021-12-18 DIAGNOSIS — E559 Vitamin D deficiency, unspecified: Secondary | ICD-10-CM | POA: Diagnosis not present

## 2021-12-18 DIAGNOSIS — N952 Postmenopausal atrophic vaginitis: Secondary | ICD-10-CM | POA: Diagnosis not present

## 2021-12-18 DIAGNOSIS — R69 Illness, unspecified: Secondary | ICD-10-CM | POA: Diagnosis not present

## 2021-12-18 DIAGNOSIS — M1711 Unilateral primary osteoarthritis, right knee: Secondary | ICD-10-CM | POA: Diagnosis not present

## 2021-12-18 DIAGNOSIS — I10 Essential (primary) hypertension: Secondary | ICD-10-CM | POA: Diagnosis not present

## 2021-12-18 DIAGNOSIS — M859 Disorder of bone density and structure, unspecified: Secondary | ICD-10-CM | POA: Diagnosis not present

## 2021-12-19 DIAGNOSIS — E559 Vitamin D deficiency, unspecified: Secondary | ICD-10-CM | POA: Diagnosis not present

## 2021-12-19 DIAGNOSIS — I10 Essential (primary) hypertension: Secondary | ICD-10-CM | POA: Diagnosis not present

## 2021-12-19 DIAGNOSIS — M859 Disorder of bone density and structure, unspecified: Secondary | ICD-10-CM | POA: Diagnosis not present

## 2021-12-19 DIAGNOSIS — R32 Unspecified urinary incontinence: Secondary | ICD-10-CM | POA: Diagnosis not present

## 2021-12-19 DIAGNOSIS — N952 Postmenopausal atrophic vaginitis: Secondary | ICD-10-CM | POA: Diagnosis not present

## 2021-12-19 DIAGNOSIS — M1711 Unilateral primary osteoarthritis, right knee: Secondary | ICD-10-CM | POA: Diagnosis not present

## 2021-12-19 DIAGNOSIS — Z87440 Personal history of urinary (tract) infections: Secondary | ICD-10-CM | POA: Diagnosis not present

## 2021-12-19 DIAGNOSIS — G309 Alzheimer's disease, unspecified: Secondary | ICD-10-CM | POA: Diagnosis not present

## 2021-12-19 DIAGNOSIS — R69 Illness, unspecified: Secondary | ICD-10-CM | POA: Diagnosis not present

## 2021-12-23 DIAGNOSIS — R32 Unspecified urinary incontinence: Secondary | ICD-10-CM | POA: Diagnosis not present

## 2021-12-23 DIAGNOSIS — R69 Illness, unspecified: Secondary | ICD-10-CM | POA: Diagnosis not present

## 2021-12-23 DIAGNOSIS — M859 Disorder of bone density and structure, unspecified: Secondary | ICD-10-CM | POA: Diagnosis not present

## 2021-12-23 DIAGNOSIS — Z87440 Personal history of urinary (tract) infections: Secondary | ICD-10-CM | POA: Diagnosis not present

## 2021-12-23 DIAGNOSIS — G309 Alzheimer's disease, unspecified: Secondary | ICD-10-CM | POA: Diagnosis not present

## 2021-12-23 DIAGNOSIS — M1711 Unilateral primary osteoarthritis, right knee: Secondary | ICD-10-CM | POA: Diagnosis not present

## 2021-12-23 DIAGNOSIS — E559 Vitamin D deficiency, unspecified: Secondary | ICD-10-CM | POA: Diagnosis not present

## 2021-12-23 DIAGNOSIS — N952 Postmenopausal atrophic vaginitis: Secondary | ICD-10-CM | POA: Diagnosis not present

## 2021-12-23 DIAGNOSIS — I10 Essential (primary) hypertension: Secondary | ICD-10-CM | POA: Diagnosis not present

## 2021-12-25 DIAGNOSIS — I10 Essential (primary) hypertension: Secondary | ICD-10-CM | POA: Diagnosis not present

## 2021-12-25 DIAGNOSIS — N952 Postmenopausal atrophic vaginitis: Secondary | ICD-10-CM | POA: Diagnosis not present

## 2021-12-25 DIAGNOSIS — R69 Illness, unspecified: Secondary | ICD-10-CM | POA: Diagnosis not present

## 2021-12-25 DIAGNOSIS — Z87440 Personal history of urinary (tract) infections: Secondary | ICD-10-CM | POA: Diagnosis not present

## 2021-12-25 DIAGNOSIS — M859 Disorder of bone density and structure, unspecified: Secondary | ICD-10-CM | POA: Diagnosis not present

## 2021-12-25 DIAGNOSIS — R32 Unspecified urinary incontinence: Secondary | ICD-10-CM | POA: Diagnosis not present

## 2021-12-25 DIAGNOSIS — G309 Alzheimer's disease, unspecified: Secondary | ICD-10-CM | POA: Diagnosis not present

## 2021-12-25 DIAGNOSIS — M1711 Unilateral primary osteoarthritis, right knee: Secondary | ICD-10-CM | POA: Diagnosis not present

## 2021-12-25 DIAGNOSIS — E559 Vitamin D deficiency, unspecified: Secondary | ICD-10-CM | POA: Diagnosis not present

## 2021-12-31 DIAGNOSIS — G309 Alzheimer's disease, unspecified: Secondary | ICD-10-CM | POA: Diagnosis not present

## 2021-12-31 DIAGNOSIS — R32 Unspecified urinary incontinence: Secondary | ICD-10-CM | POA: Diagnosis not present

## 2021-12-31 DIAGNOSIS — E559 Vitamin D deficiency, unspecified: Secondary | ICD-10-CM | POA: Diagnosis not present

## 2021-12-31 DIAGNOSIS — I10 Essential (primary) hypertension: Secondary | ICD-10-CM | POA: Diagnosis not present

## 2021-12-31 DIAGNOSIS — R69 Illness, unspecified: Secondary | ICD-10-CM | POA: Diagnosis not present

## 2021-12-31 DIAGNOSIS — M1711 Unilateral primary osteoarthritis, right knee: Secondary | ICD-10-CM | POA: Diagnosis not present

## 2021-12-31 DIAGNOSIS — N952 Postmenopausal atrophic vaginitis: Secondary | ICD-10-CM | POA: Diagnosis not present

## 2021-12-31 DIAGNOSIS — Z87440 Personal history of urinary (tract) infections: Secondary | ICD-10-CM | POA: Diagnosis not present

## 2021-12-31 DIAGNOSIS — M859 Disorder of bone density and structure, unspecified: Secondary | ICD-10-CM | POA: Diagnosis not present

## 2022-01-01 DIAGNOSIS — Z87440 Personal history of urinary (tract) infections: Secondary | ICD-10-CM | POA: Diagnosis not present

## 2022-01-01 DIAGNOSIS — R69 Illness, unspecified: Secondary | ICD-10-CM | POA: Diagnosis not present

## 2022-01-01 DIAGNOSIS — E559 Vitamin D deficiency, unspecified: Secondary | ICD-10-CM | POA: Diagnosis not present

## 2022-01-01 DIAGNOSIS — R32 Unspecified urinary incontinence: Secondary | ICD-10-CM | POA: Diagnosis not present

## 2022-01-01 DIAGNOSIS — G309 Alzheimer's disease, unspecified: Secondary | ICD-10-CM | POA: Diagnosis not present

## 2022-01-01 DIAGNOSIS — M1711 Unilateral primary osteoarthritis, right knee: Secondary | ICD-10-CM | POA: Diagnosis not present

## 2022-01-01 DIAGNOSIS — I10 Essential (primary) hypertension: Secondary | ICD-10-CM | POA: Diagnosis not present

## 2022-01-01 DIAGNOSIS — M859 Disorder of bone density and structure, unspecified: Secondary | ICD-10-CM | POA: Diagnosis not present

## 2022-01-01 DIAGNOSIS — N952 Postmenopausal atrophic vaginitis: Secondary | ICD-10-CM | POA: Diagnosis not present

## 2022-01-02 DIAGNOSIS — M859 Disorder of bone density and structure, unspecified: Secondary | ICD-10-CM | POA: Diagnosis not present

## 2022-01-02 DIAGNOSIS — M1711 Unilateral primary osteoarthritis, right knee: Secondary | ICD-10-CM | POA: Diagnosis not present

## 2022-01-02 DIAGNOSIS — G309 Alzheimer's disease, unspecified: Secondary | ICD-10-CM | POA: Diagnosis not present

## 2022-01-02 DIAGNOSIS — R69 Illness, unspecified: Secondary | ICD-10-CM | POA: Diagnosis not present

## 2022-01-02 DIAGNOSIS — Z87440 Personal history of urinary (tract) infections: Secondary | ICD-10-CM | POA: Diagnosis not present

## 2022-01-02 DIAGNOSIS — I10 Essential (primary) hypertension: Secondary | ICD-10-CM | POA: Diagnosis not present

## 2022-01-02 DIAGNOSIS — R32 Unspecified urinary incontinence: Secondary | ICD-10-CM | POA: Diagnosis not present

## 2022-01-02 DIAGNOSIS — E559 Vitamin D deficiency, unspecified: Secondary | ICD-10-CM | POA: Diagnosis not present

## 2022-01-02 DIAGNOSIS — N952 Postmenopausal atrophic vaginitis: Secondary | ICD-10-CM | POA: Diagnosis not present

## 2022-01-07 DIAGNOSIS — G309 Alzheimer's disease, unspecified: Secondary | ICD-10-CM | POA: Diagnosis not present

## 2022-01-07 DIAGNOSIS — R32 Unspecified urinary incontinence: Secondary | ICD-10-CM | POA: Diagnosis not present

## 2022-01-07 DIAGNOSIS — N952 Postmenopausal atrophic vaginitis: Secondary | ICD-10-CM | POA: Diagnosis not present

## 2022-01-07 DIAGNOSIS — R69 Illness, unspecified: Secondary | ICD-10-CM | POA: Diagnosis not present

## 2022-01-07 DIAGNOSIS — M859 Disorder of bone density and structure, unspecified: Secondary | ICD-10-CM | POA: Diagnosis not present

## 2022-01-07 DIAGNOSIS — M1711 Unilateral primary osteoarthritis, right knee: Secondary | ICD-10-CM | POA: Diagnosis not present

## 2022-01-07 DIAGNOSIS — Z87440 Personal history of urinary (tract) infections: Secondary | ICD-10-CM | POA: Diagnosis not present

## 2022-01-07 DIAGNOSIS — I10 Essential (primary) hypertension: Secondary | ICD-10-CM | POA: Diagnosis not present

## 2022-01-07 DIAGNOSIS — E559 Vitamin D deficiency, unspecified: Secondary | ICD-10-CM | POA: Diagnosis not present

## 2022-01-08 DIAGNOSIS — G309 Alzheimer's disease, unspecified: Secondary | ICD-10-CM | POA: Diagnosis not present

## 2022-01-08 DIAGNOSIS — R69 Illness, unspecified: Secondary | ICD-10-CM | POA: Diagnosis not present

## 2022-01-08 DIAGNOSIS — Z87440 Personal history of urinary (tract) infections: Secondary | ICD-10-CM | POA: Diagnosis not present

## 2022-01-08 DIAGNOSIS — E559 Vitamin D deficiency, unspecified: Secondary | ICD-10-CM | POA: Diagnosis not present

## 2022-01-08 DIAGNOSIS — R32 Unspecified urinary incontinence: Secondary | ICD-10-CM | POA: Diagnosis not present

## 2022-01-08 DIAGNOSIS — N952 Postmenopausal atrophic vaginitis: Secondary | ICD-10-CM | POA: Diagnosis not present

## 2022-01-08 DIAGNOSIS — M1711 Unilateral primary osteoarthritis, right knee: Secondary | ICD-10-CM | POA: Diagnosis not present

## 2022-01-08 DIAGNOSIS — I10 Essential (primary) hypertension: Secondary | ICD-10-CM | POA: Diagnosis not present

## 2022-01-08 DIAGNOSIS — M859 Disorder of bone density and structure, unspecified: Secondary | ICD-10-CM | POA: Diagnosis not present

## 2022-01-15 DIAGNOSIS — N952 Postmenopausal atrophic vaginitis: Secondary | ICD-10-CM | POA: Diagnosis not present

## 2022-01-15 DIAGNOSIS — E559 Vitamin D deficiency, unspecified: Secondary | ICD-10-CM | POA: Diagnosis not present

## 2022-01-15 DIAGNOSIS — R69 Illness, unspecified: Secondary | ICD-10-CM | POA: Diagnosis not present

## 2022-01-15 DIAGNOSIS — R32 Unspecified urinary incontinence: Secondary | ICD-10-CM | POA: Diagnosis not present

## 2022-01-15 DIAGNOSIS — M859 Disorder of bone density and structure, unspecified: Secondary | ICD-10-CM | POA: Diagnosis not present

## 2022-01-15 DIAGNOSIS — M1711 Unilateral primary osteoarthritis, right knee: Secondary | ICD-10-CM | POA: Diagnosis not present

## 2022-01-15 DIAGNOSIS — G309 Alzheimer's disease, unspecified: Secondary | ICD-10-CM | POA: Diagnosis not present

## 2022-01-15 DIAGNOSIS — Z87440 Personal history of urinary (tract) infections: Secondary | ICD-10-CM | POA: Diagnosis not present

## 2022-01-15 DIAGNOSIS — I10 Essential (primary) hypertension: Secondary | ICD-10-CM | POA: Diagnosis not present

## 2022-01-22 DIAGNOSIS — M859 Disorder of bone density and structure, unspecified: Secondary | ICD-10-CM | POA: Diagnosis not present

## 2022-01-22 DIAGNOSIS — I10 Essential (primary) hypertension: Secondary | ICD-10-CM | POA: Diagnosis not present

## 2022-01-22 DIAGNOSIS — Z87440 Personal history of urinary (tract) infections: Secondary | ICD-10-CM | POA: Diagnosis not present

## 2022-01-22 DIAGNOSIS — M1711 Unilateral primary osteoarthritis, right knee: Secondary | ICD-10-CM | POA: Diagnosis not present

## 2022-01-22 DIAGNOSIS — G309 Alzheimer's disease, unspecified: Secondary | ICD-10-CM | POA: Diagnosis not present

## 2022-01-22 DIAGNOSIS — N952 Postmenopausal atrophic vaginitis: Secondary | ICD-10-CM | POA: Diagnosis not present

## 2022-01-22 DIAGNOSIS — E559 Vitamin D deficiency, unspecified: Secondary | ICD-10-CM | POA: Diagnosis not present

## 2022-01-22 DIAGNOSIS — R69 Illness, unspecified: Secondary | ICD-10-CM | POA: Diagnosis not present

## 2022-01-22 DIAGNOSIS — R32 Unspecified urinary incontinence: Secondary | ICD-10-CM | POA: Diagnosis not present

## 2022-01-29 DIAGNOSIS — I10 Essential (primary) hypertension: Secondary | ICD-10-CM | POA: Diagnosis not present

## 2022-01-29 DIAGNOSIS — M859 Disorder of bone density and structure, unspecified: Secondary | ICD-10-CM | POA: Diagnosis not present

## 2022-01-29 DIAGNOSIS — Z87440 Personal history of urinary (tract) infections: Secondary | ICD-10-CM | POA: Diagnosis not present

## 2022-01-29 DIAGNOSIS — M1711 Unilateral primary osteoarthritis, right knee: Secondary | ICD-10-CM | POA: Diagnosis not present

## 2022-01-29 DIAGNOSIS — E559 Vitamin D deficiency, unspecified: Secondary | ICD-10-CM | POA: Diagnosis not present

## 2022-01-29 DIAGNOSIS — R69 Illness, unspecified: Secondary | ICD-10-CM | POA: Diagnosis not present

## 2022-01-29 DIAGNOSIS — G309 Alzheimer's disease, unspecified: Secondary | ICD-10-CM | POA: Diagnosis not present

## 2022-01-29 DIAGNOSIS — R32 Unspecified urinary incontinence: Secondary | ICD-10-CM | POA: Diagnosis not present

## 2022-01-29 DIAGNOSIS — N952 Postmenopausal atrophic vaginitis: Secondary | ICD-10-CM | POA: Diagnosis not present

## 2022-01-30 DIAGNOSIS — I10 Essential (primary) hypertension: Secondary | ICD-10-CM | POA: Diagnosis not present

## 2022-02-04 DIAGNOSIS — E559 Vitamin D deficiency, unspecified: Secondary | ICD-10-CM | POA: Diagnosis not present

## 2022-02-04 DIAGNOSIS — I10 Essential (primary) hypertension: Secondary | ICD-10-CM | POA: Diagnosis not present

## 2022-02-04 DIAGNOSIS — M859 Disorder of bone density and structure, unspecified: Secondary | ICD-10-CM | POA: Diagnosis not present

## 2022-02-04 DIAGNOSIS — N952 Postmenopausal atrophic vaginitis: Secondary | ICD-10-CM | POA: Diagnosis not present

## 2022-02-04 DIAGNOSIS — R69 Illness, unspecified: Secondary | ICD-10-CM | POA: Diagnosis not present

## 2022-02-04 DIAGNOSIS — G309 Alzheimer's disease, unspecified: Secondary | ICD-10-CM | POA: Diagnosis not present

## 2022-02-04 DIAGNOSIS — M1711 Unilateral primary osteoarthritis, right knee: Secondary | ICD-10-CM | POA: Diagnosis not present

## 2022-02-04 DIAGNOSIS — R32 Unspecified urinary incontinence: Secondary | ICD-10-CM | POA: Diagnosis not present

## 2022-02-04 DIAGNOSIS — Z87440 Personal history of urinary (tract) infections: Secondary | ICD-10-CM | POA: Diagnosis not present

## 2022-02-10 DIAGNOSIS — E785 Hyperlipidemia, unspecified: Secondary | ICD-10-CM | POA: Diagnosis not present

## 2022-02-10 DIAGNOSIS — E559 Vitamin D deficiency, unspecified: Secondary | ICD-10-CM | POA: Diagnosis not present

## 2022-02-10 DIAGNOSIS — R7301 Impaired fasting glucose: Secondary | ICD-10-CM | POA: Diagnosis not present

## 2022-02-11 DIAGNOSIS — M8589 Other specified disorders of bone density and structure, multiple sites: Secondary | ICD-10-CM | POA: Diagnosis not present

## 2022-02-11 DIAGNOSIS — R7301 Impaired fasting glucose: Secondary | ICD-10-CM | POA: Diagnosis not present

## 2022-02-11 DIAGNOSIS — E785 Hyperlipidemia, unspecified: Secondary | ICD-10-CM | POA: Diagnosis not present

## 2022-02-11 DIAGNOSIS — G309 Alzheimer's disease, unspecified: Secondary | ICD-10-CM | POA: Diagnosis not present

## 2022-02-11 DIAGNOSIS — R69 Illness, unspecified: Secondary | ICD-10-CM | POA: Diagnosis not present

## 2022-02-11 DIAGNOSIS — I10 Essential (primary) hypertension: Secondary | ICD-10-CM | POA: Diagnosis not present

## 2022-02-11 DIAGNOSIS — E559 Vitamin D deficiency, unspecified: Secondary | ICD-10-CM | POA: Diagnosis not present

## 2022-02-11 DIAGNOSIS — Z86711 Personal history of pulmonary embolism: Secondary | ICD-10-CM | POA: Diagnosis not present

## 2022-04-22 DIAGNOSIS — G309 Alzheimer's disease, unspecified: Secondary | ICD-10-CM | POA: Diagnosis not present

## 2022-04-22 DIAGNOSIS — M47816 Spondylosis without myelopathy or radiculopathy, lumbar region: Secondary | ICD-10-CM | POA: Diagnosis not present

## 2022-04-22 DIAGNOSIS — G8929 Other chronic pain: Secondary | ICD-10-CM | POA: Diagnosis not present

## 2022-04-22 DIAGNOSIS — M25552 Pain in left hip: Secondary | ICD-10-CM | POA: Diagnosis not present

## 2022-04-22 DIAGNOSIS — R69 Illness, unspecified: Secondary | ICD-10-CM | POA: Diagnosis not present

## 2022-04-22 DIAGNOSIS — M25551 Pain in right hip: Secondary | ICD-10-CM | POA: Diagnosis not present

## 2022-04-22 DIAGNOSIS — M545 Low back pain, unspecified: Secondary | ICD-10-CM | POA: Diagnosis not present

## 2022-04-22 DIAGNOSIS — M160 Bilateral primary osteoarthritis of hip: Secondary | ICD-10-CM | POA: Diagnosis not present

## 2022-06-13 DIAGNOSIS — R262 Difficulty in walking, not elsewhere classified: Secondary | ICD-10-CM | POA: Diagnosis not present

## 2022-07-07 DIAGNOSIS — G309 Alzheimer's disease, unspecified: Secondary | ICD-10-CM | POA: Diagnosis not present

## 2022-07-07 DIAGNOSIS — L89300 Pressure ulcer of unspecified buttock, unstageable: Secondary | ICD-10-CM | POA: Diagnosis not present

## 2022-07-07 DIAGNOSIS — N390 Urinary tract infection, site not specified: Secondary | ICD-10-CM | POA: Diagnosis not present

## 2022-07-07 DIAGNOSIS — N952 Postmenopausal atrophic vaginitis: Secondary | ICD-10-CM | POA: Diagnosis not present

## 2022-07-07 DIAGNOSIS — R32 Unspecified urinary incontinence: Secondary | ICD-10-CM | POA: Diagnosis not present

## 2022-08-11 DIAGNOSIS — F5104 Psychophysiologic insomnia: Secondary | ICD-10-CM | POA: Diagnosis not present

## 2022-08-11 DIAGNOSIS — R69 Illness, unspecified: Secondary | ICD-10-CM | POA: Diagnosis not present

## 2022-08-11 DIAGNOSIS — G8929 Other chronic pain: Secondary | ICD-10-CM | POA: Diagnosis not present

## 2022-08-11 DIAGNOSIS — M8589 Other specified disorders of bone density and structure, multiple sites: Secondary | ICD-10-CM | POA: Diagnosis not present

## 2022-08-11 DIAGNOSIS — R7301 Impaired fasting glucose: Secondary | ICD-10-CM | POA: Diagnosis not present

## 2022-08-11 DIAGNOSIS — M545 Low back pain, unspecified: Secondary | ICD-10-CM | POA: Diagnosis not present

## 2022-08-11 DIAGNOSIS — Z86711 Personal history of pulmonary embolism: Secondary | ICD-10-CM | POA: Diagnosis not present

## 2022-08-11 DIAGNOSIS — E559 Vitamin D deficiency, unspecified: Secondary | ICD-10-CM | POA: Diagnosis not present

## 2022-08-11 DIAGNOSIS — E785 Hyperlipidemia, unspecified: Secondary | ICD-10-CM | POA: Diagnosis not present

## 2022-08-11 DIAGNOSIS — G309 Alzheimer's disease, unspecified: Secondary | ICD-10-CM | POA: Diagnosis not present

## 2022-08-11 DIAGNOSIS — I10 Essential (primary) hypertension: Secondary | ICD-10-CM | POA: Diagnosis not present

## 2022-08-14 DIAGNOSIS — I10 Essential (primary) hypertension: Secondary | ICD-10-CM | POA: Diagnosis not present

## 2022-08-14 DIAGNOSIS — E785 Hyperlipidemia, unspecified: Secondary | ICD-10-CM | POA: Diagnosis not present

## 2022-10-02 DIAGNOSIS — Z23 Encounter for immunization: Secondary | ICD-10-CM | POA: Diagnosis not present

## 2022-10-02 DIAGNOSIS — H1031 Unspecified acute conjunctivitis, right eye: Secondary | ICD-10-CM | POA: Diagnosis not present

## 2022-11-12 DIAGNOSIS — N390 Urinary tract infection, site not specified: Secondary | ICD-10-CM | POA: Diagnosis not present

## 2022-11-12 DIAGNOSIS — R32 Unspecified urinary incontinence: Secondary | ICD-10-CM | POA: Diagnosis not present

## 2022-12-14 DIAGNOSIS — R262 Difficulty in walking, not elsewhere classified: Secondary | ICD-10-CM | POA: Diagnosis not present

## 2022-12-25 DIAGNOSIS — R9431 Abnormal electrocardiogram [ECG] [EKG]: Secondary | ICD-10-CM | POA: Diagnosis not present

## 2022-12-25 DIAGNOSIS — I2694 Multiple subsegmental pulmonary emboli without acute cor pulmonale: Secondary | ICD-10-CM | POA: Diagnosis not present

## 2022-12-25 DIAGNOSIS — Z1152 Encounter for screening for COVID-19: Secondary | ICD-10-CM | POA: Diagnosis not present

## 2022-12-25 DIAGNOSIS — G309 Alzheimer's disease, unspecified: Secondary | ICD-10-CM | POA: Diagnosis not present

## 2022-12-25 DIAGNOSIS — E785 Hyperlipidemia, unspecified: Secondary | ICD-10-CM | POA: Diagnosis not present

## 2022-12-25 DIAGNOSIS — I82452 Acute embolism and thrombosis of left peroneal vein: Secondary | ICD-10-CM | POA: Diagnosis not present

## 2022-12-25 DIAGNOSIS — F0280 Dementia in other diseases classified elsewhere without behavioral disturbance: Secondary | ICD-10-CM | POA: Diagnosis not present

## 2022-12-25 DIAGNOSIS — J8410 Pulmonary fibrosis, unspecified: Secondary | ICD-10-CM | POA: Diagnosis not present

## 2022-12-25 DIAGNOSIS — I2699 Other pulmonary embolism without acute cor pulmonale: Secondary | ICD-10-CM | POA: Diagnosis not present

## 2022-12-25 DIAGNOSIS — R69 Illness, unspecified: Secondary | ICD-10-CM | POA: Diagnosis not present

## 2022-12-25 DIAGNOSIS — Z7901 Long term (current) use of anticoagulants: Secondary | ICD-10-CM | POA: Diagnosis not present

## 2022-12-25 DIAGNOSIS — G4733 Obstructive sleep apnea (adult) (pediatric): Secondary | ICD-10-CM | POA: Diagnosis not present

## 2022-12-25 DIAGNOSIS — R0602 Shortness of breath: Secondary | ICD-10-CM | POA: Diagnosis not present

## 2022-12-25 DIAGNOSIS — R531 Weakness: Secondary | ICD-10-CM | POA: Diagnosis not present

## 2022-12-25 DIAGNOSIS — I10 Essential (primary) hypertension: Secondary | ICD-10-CM | POA: Diagnosis not present

## 2022-12-25 DIAGNOSIS — Z86711 Personal history of pulmonary embolism: Secondary | ICD-10-CM | POA: Diagnosis not present

## 2022-12-25 DIAGNOSIS — J90 Pleural effusion, not elsewhere classified: Secondary | ICD-10-CM | POA: Diagnosis not present

## 2022-12-25 DIAGNOSIS — Z66 Do not resuscitate: Secondary | ICD-10-CM | POA: Diagnosis not present

## 2022-12-26 DIAGNOSIS — I2699 Other pulmonary embolism without acute cor pulmonale: Secondary | ICD-10-CM | POA: Diagnosis not present

## 2022-12-26 DIAGNOSIS — I82402 Acute embolism and thrombosis of unspecified deep veins of left lower extremity: Secondary | ICD-10-CM | POA: Diagnosis not present

## 2022-12-26 DIAGNOSIS — E785 Hyperlipidemia, unspecified: Secondary | ICD-10-CM | POA: Diagnosis not present

## 2022-12-26 DIAGNOSIS — I10 Essential (primary) hypertension: Secondary | ICD-10-CM | POA: Diagnosis not present

## 2022-12-29 DIAGNOSIS — G309 Alzheimer's disease, unspecified: Secondary | ICD-10-CM | POA: Diagnosis not present

## 2022-12-29 DIAGNOSIS — S40021A Contusion of right upper arm, initial encounter: Secondary | ICD-10-CM | POA: Diagnosis not present

## 2022-12-29 DIAGNOSIS — I10 Essential (primary) hypertension: Secondary | ICD-10-CM | POA: Diagnosis not present

## 2022-12-29 DIAGNOSIS — I824Z2 Acute embolism and thrombosis of unspecified deep veins of left distal lower extremity: Secondary | ICD-10-CM | POA: Diagnosis not present

## 2022-12-29 DIAGNOSIS — E785 Hyperlipidemia, unspecified: Secondary | ICD-10-CM | POA: Diagnosis not present

## 2022-12-29 DIAGNOSIS — I2699 Other pulmonary embolism without acute cor pulmonale: Secondary | ICD-10-CM | POA: Diagnosis not present

## 2022-12-29 DIAGNOSIS — R69 Illness, unspecified: Secondary | ICD-10-CM | POA: Diagnosis not present

## 2022-12-31 DIAGNOSIS — H269 Unspecified cataract: Secondary | ICD-10-CM | POA: Diagnosis not present

## 2022-12-31 DIAGNOSIS — M1711 Unilateral primary osteoarthritis, right knee: Secondary | ICD-10-CM | POA: Diagnosis not present

## 2022-12-31 DIAGNOSIS — B029 Zoster without complications: Secondary | ICD-10-CM | POA: Diagnosis not present

## 2022-12-31 DIAGNOSIS — G309 Alzheimer's disease, unspecified: Secondary | ICD-10-CM | POA: Diagnosis not present

## 2022-12-31 DIAGNOSIS — E7800 Pure hypercholesterolemia, unspecified: Secondary | ICD-10-CM | POA: Diagnosis not present

## 2022-12-31 DIAGNOSIS — H1031 Unspecified acute conjunctivitis, right eye: Secondary | ICD-10-CM | POA: Diagnosis not present

## 2022-12-31 DIAGNOSIS — E559 Vitamin D deficiency, unspecified: Secondary | ICD-10-CM | POA: Diagnosis not present

## 2022-12-31 DIAGNOSIS — I10 Essential (primary) hypertension: Secondary | ICD-10-CM | POA: Diagnosis not present

## 2022-12-31 DIAGNOSIS — I2699 Other pulmonary embolism without acute cor pulmonale: Secondary | ICD-10-CM | POA: Diagnosis not present

## 2022-12-31 DIAGNOSIS — F0280 Dementia in other diseases classified elsewhere without behavioral disturbance: Secondary | ICD-10-CM | POA: Diagnosis not present

## 2022-12-31 DIAGNOSIS — M791 Myalgia, unspecified site: Secondary | ICD-10-CM | POA: Diagnosis not present

## 2022-12-31 DIAGNOSIS — R918 Other nonspecific abnormal finding of lung field: Secondary | ICD-10-CM | POA: Diagnosis not present

## 2022-12-31 DIAGNOSIS — I82432 Acute embolism and thrombosis of left popliteal vein: Secondary | ICD-10-CM | POA: Diagnosis not present

## 2022-12-31 DIAGNOSIS — Z7901 Long term (current) use of anticoagulants: Secondary | ICD-10-CM | POA: Diagnosis not present

## 2022-12-31 DIAGNOSIS — E669 Obesity, unspecified: Secondary | ICD-10-CM | POA: Diagnosis not present

## 2022-12-31 DIAGNOSIS — Z556 Problems related to health literacy: Secondary | ICD-10-CM | POA: Diagnosis not present

## 2022-12-31 DIAGNOSIS — F331 Major depressive disorder, recurrent, moderate: Secondary | ICD-10-CM | POA: Diagnosis not present

## 2022-12-31 DIAGNOSIS — F5104 Psychophysiologic insomnia: Secondary | ICD-10-CM | POA: Diagnosis not present

## 2022-12-31 DIAGNOSIS — J9601 Acute respiratory failure with hypoxia: Secondary | ICD-10-CM | POA: Diagnosis not present

## 2022-12-31 DIAGNOSIS — J302 Other seasonal allergic rhinitis: Secondary | ICD-10-CM | POA: Diagnosis not present

## 2022-12-31 DIAGNOSIS — Z6841 Body Mass Index (BMI) 40.0 and over, adult: Secondary | ICD-10-CM | POA: Diagnosis not present

## 2022-12-31 DIAGNOSIS — G4733 Obstructive sleep apnea (adult) (pediatric): Secondary | ICD-10-CM | POA: Diagnosis not present

## 2022-12-31 DIAGNOSIS — M8589 Other specified disorders of bone density and structure, multiple sites: Secondary | ICD-10-CM | POA: Diagnosis not present

## 2022-12-31 DIAGNOSIS — R69 Illness, unspecified: Secondary | ICD-10-CM | POA: Diagnosis not present

## 2023-01-02 ENCOUNTER — Other Ambulatory Visit: Payer: Self-pay | Admitting: Oncology

## 2023-01-02 ENCOUNTER — Encounter: Payer: Self-pay | Admitting: Oncology

## 2023-01-02 ENCOUNTER — Inpatient Hospital Stay: Payer: Medicare HMO

## 2023-01-02 ENCOUNTER — Inpatient Hospital Stay: Payer: Medicare HMO | Attending: Oncology | Admitting: Oncology

## 2023-01-02 VITALS — BP 119/76 | HR 86 | Temp 98.5°F | Resp 16 | Ht 65.0 in | Wt 231.9 lb

## 2023-01-02 DIAGNOSIS — R079 Chest pain, unspecified: Secondary | ICD-10-CM

## 2023-01-02 DIAGNOSIS — I2699 Other pulmonary embolism without acute cor pulmonale: Secondary | ICD-10-CM | POA: Diagnosis not present

## 2023-01-02 DIAGNOSIS — R0602 Shortness of breath: Secondary | ICD-10-CM | POA: Diagnosis not present

## 2023-01-02 DIAGNOSIS — I2690 Septic pulmonary embolism without acute cor pulmonale: Secondary | ICD-10-CM | POA: Diagnosis not present

## 2023-01-02 MED ORDER — FONDAPARINUX SODIUM 10 MG/0.8ML ~~LOC~~ SOLN: 10.0000 mg | SUBCUTANEOUS | 30 refills | Status: AC

## 2023-01-02 NOTE — Progress Notes (Signed)
[facility]  [address] [place],  [place]  [address] [phone]  Clinic Day:  01/04/2023  Referring physician: Nicoletta Dress, MD   HISTORY OF PRESENT ILLNESS:  The patient is a 89 y.o. female  who I was asked to consult upon for having bilateral pulmonary emboli.  According to the patient's family, she had progressive shortness of breath over days.    This led her family bringing her into the emergency room, where a CT angiogram showed evidence of small, but bilateral, pulmonary emboli.  There was also evidence of a DVT in her left peroneal vein. These developed while the patient was on Eliquis.  According to her family, the patient had a saddle pulmonary embolus in 2021, as well as a left lower extremity DVT.  This is what led to her being placed on Eliquis twice daily, which she was compliant with up until the time her recent pulmonary emboli were found.  Over the past week, the patient has been taking Lovenox BID.  However, her family is concerned about how difficult it will be for them to give her these shots twice daily indefinitely.  To her family's knowledge, there is no family history of blood clots.  The patient personally never had blood clots after previous surgeries.  There is no history of any trauma that could have her blood clots.  PAST MEDICAL HISTORY:   Past Medical History:  Diagnosis Date   Anxiety    Arthritis    Cancer ([place])    on nose,removed   Dementia ([place])    Depression    Patient is Jehovah's Witness    Pulmonary embolism ([place]) 02/2020   Sleep apnea    cpap    PAST SURGICAL HISTORY:   Past Surgical History:  Procedure Laterality Date   CATARACT EXTRACTION Bilateral    TONSILLECTOMY     TOTAL KNEE ARTHROPLASTY Right 01/21/2016   Procedure: RIGHT TOTAL KNEE ARTHROPLASTY;  Surgeon: Vickey Huger, MD;  Location: [facility];  Service: Orthopedics;  Laterality: Right;    CURRENT MEDICATIONS:   Current Outpatient  Medications  Medication Sig Dispense Refill   COMIRNATY SUSP injection      enoxaparin (LOVENOX) 60 MG/0.6ML injection SMARTSIG:105 Milligram(s) SUB-Q Every 12 Hours     GAVILAX 17 GM/SCOOP powder SMARTSIG:1 Scoopful By Mouth Twice Daily     tobramycin (TOBREX) 0.3 % ophthalmic solution Place 2 drops into the right eye 4 (four) times daily.     traZODone (DESYREL) 50 MG tablet Take 50 mg by mouth at bedtime as needed.     triamcinolone cream (KENALOG) 0.1 % Apply topically 2 (two) times daily as needed.     alendronate (FOSAMAX) 70 MG tablet Take 70 mg by mouth every Friday.     benzonatate (TESSALON) 100 MG capsule Take 1 capsule (100 mg total) by mouth 3 (three) times daily as needed for cough. 20 capsule 0   fondaparinux (ARIXTRA) 10 MG/0.8ML SOLN injection Inject 0.8 mLs (10 mg total) into the skin daily. 24 mL 30   furosemide (LASIX) 40 MG tablet Take 0.5 tablets (20 mg total) by mouth every other day. 30 tablet    guaiFENesin (MUCINEX) 600 MG 12 hr tablet Take 600 mg by mouth 2 (two) times daily.     lisinopril (ZESTRIL) 20 MG tablet Take 20 mg by mouth daily with supper.     memantine (NAMENDA) 10 MG tablet Take 10 mg by mouth 2 (two) times daily.  mirabegron ER (MYRBETRIQ) 50 MG TB24 tablet Take 50 mg by mouth daily with supper.     Multiple Vitamins-Minerals (MULTIVITAMIN WITH MINERALS) tablet Take 1 tablet by mouth daily with supper.      naproxen sodium (ALEVE) 220 MG tablet Take 220-440 mg by mouth 2 (two) times daily as needed (pain).     PRESCRIPTION MEDICATION Inhale into the lungs at bedtime. BIPAP     rivastigmine (EXELON) 9.5 mg/24hr Place 9.5 mg onto the skin every evening.      solifenacin (VESICARE) 5 MG tablet Take 5 mg by mouth daily with supper.     venlafaxine XR (EFFEXOR-XR) 75 MG 24 hr capsule Take 75 mg by mouth daily with supper.      Vitamin D, Ergocalciferol, (DRISDOL) 50000 units CAPS capsule Take 50,000 Units by mouth every Friday.      No current  facility-administered medications for this visit.    ALLERGIES:   Allergies  Allergen Reactions   Other     NO BLOOD OR BLOOD PRODUCTS - Jehovah's witness    FAMILY HISTORY:   Family History  Problem Relation Age of Onset   Heart failure Mother    Hypertension Sister    CVA Sister     SOCIAL HISTORY:  The patient was born in [place]. She lives in town with her husband of 59 years.  They have 1 son and 4 grandchildren.  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  There is no history of alcoholism or tobacco abuse.    REVIEW OF SYSTEMS:  Review of Systems  Constitutional:  Negative for fatigue and fever.  HENT:   Negative for hearing loss and sore throat.   Eyes:  Negative for eye problems.  Respiratory:  Positive for shortness of breath. Negative for chest tightness, cough and hemoptysis.   Cardiovascular:  Positive for chest pain. Negative for palpitations.  Gastrointestinal:  Negative for abdominal distention, abdominal pain, blood in stool, constipation, diarrhea, nausea and vomiting.  Endocrine: Negative for hot flashes.  Genitourinary:  Negative for difficulty urinating, dysuria, frequency, hematuria and nocturia.   Musculoskeletal:  Positive for myalgias. Negative for arthralgias, back pain and gait problem.  Skin: Negative.  Negative for itching and rash.  Neurological:  Positive for headaches. Negative for dizziness, extremity weakness, gait problem, light-headedness and numbness.  Hematological: Negative.   Psychiatric/Behavioral: Negative.  Negative for depression and suicidal ideas. The patient is not nervous/anxious.      PHYSICAL EXAM:  Blood pressure 119/76, pulse 86, temperature 98.5 F (36.9 C), resp. rate 16, height '5\' 5"'$  (1.651 m), weight 231 lb 14.4 oz (105.2 kg), SpO2 94 %. Wt Readings from Last 3 Encounters:  01/02/23 231 lb 14.4 oz (105.2 kg)  03/10/20 239 lb 10.2 oz (108.7 kg)  01/10/16 200 lb 6.4 oz (90.9 kg)   Body mass index is 38.59  kg/m. Performance status (ECOG): 3 - Symptomatic, >50% confined to bed Physical Exam Constitutional:      Appearance: Normal appearance. She is not ill-appearing.     Comments: A chronically ill appearing woman who is in a wheelchair and has dementia.    HENT:     Mouth/Throat:     Mouth: Mucous membranes are moist.     Pharynx: Oropharynx is clear. No oropharyngeal exudate or posterior oropharyngeal erythema.  Cardiovascular:     Rate and Rhythm: Normal rate and regular rhythm.     Heart sounds: No murmur heard.    No friction  rub. No gallop.  Pulmonary:     Effort: Pulmonary effort is normal. No respiratory distress.     Breath sounds: Normal breath sounds. No wheezing, rhonchi or rales.  Abdominal:     General: Bowel sounds are normal. There is no distension.     Palpations: Abdomen is soft. There is no mass.     Tenderness: There is no abdominal tenderness.  Musculoskeletal:        General: No swelling.     Right lower leg: No edema.     Left lower leg: No edema.  Lymphadenopathy:     Cervical: No cervical adenopathy.     Upper Body:     Right upper body: No supraclavicular or axillary adenopathy.     Left upper body: No supraclavicular or axillary adenopathy.     Lower Body: No right inguinal adenopathy. No left inguinal adenopathy.  Skin:    General: Skin is warm.     Coloration: Skin is not jaundiced.     Findings: No lesion or rash.  Neurological:     General: No focal deficit present.     Mental Status: She is alert and oriented to person, place, and time. Mental status is at baseline.  Psychiatric:        Mood and Affect: Mood normal.        Behavior: Behavior normal.        Thought Content: Thought content normal.    ASSESSMENT & PLAN:  An 89 y.o. female who I was asked to consult upon for having bilateral pulmonary emboli, which developed while being compliant with Eliquis.  She is currently on Lovenox BID.  Although her family makes sure she gets her Lovenox  injections, the BID schedule is becoming taxing on them.  Based upon this, I will place her on Arixtra 10 mg SQ daily, which is also standard of care for an acute pulmonary embolus.  The patient's family understands she will need to be on lifelong anticoagulation.  As she developed blood clots late in life and there is no family history of blood clots, I do not believe a hypercoagulable workup is necessary.  As there are no other hematologic issues apparent, I do feel comfortable turning her care back over to her other physicians.  The patient and her family understand all the plans discussed today and are in agreement with them.  I do appreciate Nicoletta Dress, MD for his new consult.   Tran Randle Macarthur Critchley, MD

## 2023-01-26 ENCOUNTER — Telehealth: Payer: Self-pay

## 2023-01-26 NOTE — Telephone Encounter (Signed)
Pt's spouse very concerned with the amount of bruising and large lumps he wife has on the right side of her abdomen from the lovenox injections. He was told not to apply heat to the areas. The switched her to Arixtra injections, which he is injecting on the left side of her abdomen. He states the left side is now starting to become bruised and lumps forming. I inquired if he was making sure not to massage the area after injection? He replied, "no massaging". I also asked him to make sure there is NO medication on the needle before injecting it into the skin. Pt was initially on Eliquis, but developed blood clots while on the medication. He wants to know "is there any other oral blood thinner she could take"? I notified Dr Bobby Rumpf of all above. The only other medication is Pradaxa, which is in the same classification as Eliquis, yet more expensive.

## 2023-02-11 DIAGNOSIS — E559 Vitamin D deficiency, unspecified: Secondary | ICD-10-CM | POA: Diagnosis not present

## 2023-02-11 DIAGNOSIS — Z79899 Other long term (current) drug therapy: Secondary | ICD-10-CM | POA: Diagnosis not present

## 2023-02-11 DIAGNOSIS — I700 Atherosclerosis of aorta: Secondary | ICD-10-CM | POA: Diagnosis not present

## 2023-02-11 DIAGNOSIS — G309 Alzheimer's disease, unspecified: Secondary | ICD-10-CM | POA: Diagnosis not present

## 2023-02-11 DIAGNOSIS — I10 Essential (primary) hypertension: Secondary | ICD-10-CM | POA: Diagnosis not present

## 2023-02-11 DIAGNOSIS — R69 Illness, unspecified: Secondary | ICD-10-CM | POA: Diagnosis not present

## 2023-02-11 DIAGNOSIS — R7301 Impaired fasting glucose: Secondary | ICD-10-CM | POA: Diagnosis not present

## 2023-02-11 DIAGNOSIS — E785 Hyperlipidemia, unspecified: Secondary | ICD-10-CM | POA: Diagnosis not present

## 2023-02-12 DIAGNOSIS — I10 Essential (primary) hypertension: Secondary | ICD-10-CM | POA: Diagnosis not present

## 2023-02-12 DIAGNOSIS — E785 Hyperlipidemia, unspecified: Secondary | ICD-10-CM | POA: Diagnosis not present

## 2023-08-12 DIAGNOSIS — G309 Alzheimer's disease, unspecified: Secondary | ICD-10-CM | POA: Diagnosis not present

## 2023-08-12 DIAGNOSIS — I700 Atherosclerosis of aorta: Secondary | ICD-10-CM | POA: Diagnosis not present

## 2023-08-12 DIAGNOSIS — I10 Essential (primary) hypertension: Secondary | ICD-10-CM | POA: Diagnosis not present

## 2023-08-12 DIAGNOSIS — F0280 Dementia in other diseases classified elsewhere without behavioral disturbance: Secondary | ICD-10-CM | POA: Diagnosis not present

## 2023-08-12 DIAGNOSIS — R7301 Impaired fasting glucose: Secondary | ICD-10-CM | POA: Diagnosis not present

## 2023-08-12 DIAGNOSIS — E785 Hyperlipidemia, unspecified: Secondary | ICD-10-CM | POA: Diagnosis not present

## 2023-11-16 DIAGNOSIS — Z0000 Encounter for general adult medical examination without abnormal findings: Secondary | ICD-10-CM | POA: Diagnosis not present

## 2023-11-16 DIAGNOSIS — Z9181 History of falling: Secondary | ICD-10-CM | POA: Diagnosis not present

## 2024-02-16 DIAGNOSIS — Z86718 Personal history of other venous thrombosis and embolism: Secondary | ICD-10-CM | POA: Diagnosis not present

## 2024-02-16 DIAGNOSIS — F0280 Dementia in other diseases classified elsewhere without behavioral disturbance: Secondary | ICD-10-CM | POA: Diagnosis not present

## 2024-02-16 DIAGNOSIS — I10 Essential (primary) hypertension: Secondary | ICD-10-CM | POA: Diagnosis not present

## 2024-02-16 DIAGNOSIS — E785 Hyperlipidemia, unspecified: Secondary | ICD-10-CM | POA: Diagnosis not present

## 2024-02-16 DIAGNOSIS — R7301 Impaired fasting glucose: Secondary | ICD-10-CM | POA: Diagnosis not present

## 2024-02-16 DIAGNOSIS — E559 Vitamin D deficiency, unspecified: Secondary | ICD-10-CM | POA: Diagnosis not present

## 2024-02-16 DIAGNOSIS — Z86711 Personal history of pulmonary embolism: Secondary | ICD-10-CM | POA: Diagnosis not present

## 2024-02-16 DIAGNOSIS — G309 Alzheimer's disease, unspecified: Secondary | ICD-10-CM | POA: Diagnosis not present

## 2024-02-16 DIAGNOSIS — Z23 Encounter for immunization: Secondary | ICD-10-CM | POA: Diagnosis not present

## 2024-03-28 DIAGNOSIS — R32 Unspecified urinary incontinence: Secondary | ICD-10-CM | POA: Diagnosis not present

## 2024-03-28 DIAGNOSIS — N390 Urinary tract infection, site not specified: Secondary | ICD-10-CM | POA: Diagnosis not present

## 2024-07-04 DIAGNOSIS — N390 Urinary tract infection, site not specified: Secondary | ICD-10-CM | POA: Diagnosis not present

## 2024-07-22 DIAGNOSIS — N390 Urinary tract infection, site not specified: Secondary | ICD-10-CM | POA: Diagnosis not present

## 2024-08-22 DIAGNOSIS — I10 Essential (primary) hypertension: Secondary | ICD-10-CM | POA: Diagnosis not present

## 2024-08-22 DIAGNOSIS — E559 Vitamin D deficiency, unspecified: Secondary | ICD-10-CM | POA: Diagnosis not present

## 2024-08-22 DIAGNOSIS — G309 Alzheimer's disease, unspecified: Secondary | ICD-10-CM | POA: Diagnosis not present

## 2024-08-22 DIAGNOSIS — E785 Hyperlipidemia, unspecified: Secondary | ICD-10-CM | POA: Diagnosis not present

## 2024-08-22 DIAGNOSIS — Z86711 Personal history of pulmonary embolism: Secondary | ICD-10-CM | POA: Diagnosis not present

## 2024-08-22 DIAGNOSIS — R7301 Impaired fasting glucose: Secondary | ICD-10-CM | POA: Diagnosis not present

## 2024-08-22 DIAGNOSIS — G4733 Obstructive sleep apnea (adult) (pediatric): Secondary | ICD-10-CM | POA: Diagnosis not present

## 2024-08-22 DIAGNOSIS — Z86718 Personal history of other venous thrombosis and embolism: Secondary | ICD-10-CM | POA: Diagnosis not present

## 2024-08-22 DIAGNOSIS — I700 Atherosclerosis of aorta: Secondary | ICD-10-CM | POA: Diagnosis not present

## 2024-08-22 DIAGNOSIS — F0280 Dementia in other diseases classified elsewhere without behavioral disturbance: Secondary | ICD-10-CM | POA: Diagnosis not present

## 2024-08-23 DIAGNOSIS — G4733 Obstructive sleep apnea (adult) (pediatric): Secondary | ICD-10-CM | POA: Diagnosis not present
